# Patient Record
Sex: Female | Born: 1950 | Race: Black or African American | Hispanic: No | Smoking: Former smoker
Health system: Southern US, Community
[De-identification: ages and names within clinical notes are randomized; demographics above are authoritative.]

## PROBLEM LIST (undated history)

## (undated) DIAGNOSIS — M2011 Hallux valgus (acquired), right foot: Secondary | ICD-10-CM

## (undated) DIAGNOSIS — M65341 Trigger finger, right ring finger: Secondary | ICD-10-CM

## (undated) DIAGNOSIS — Z780 Asymptomatic menopausal state: Secondary | ICD-10-CM

## (undated) DIAGNOSIS — M2041 Other hammer toe(s) (acquired), right foot: Secondary | ICD-10-CM

## (undated) DIAGNOSIS — T7840XA Allergy, unspecified, initial encounter: Secondary | ICD-10-CM

## (undated) DIAGNOSIS — R55 Syncope and collapse: Secondary | ICD-10-CM

## (undated) DIAGNOSIS — M65331 Trigger finger, right middle finger: Secondary | ICD-10-CM

## (undated) DIAGNOSIS — D869 Sarcoidosis, unspecified: Secondary | ICD-10-CM

## (undated) HISTORY — DX: Allergy, unspecified, initial encounter: T78.40XA

## (undated) HISTORY — DX: Trigger finger, right middle finger: M65.331

## (undated) HISTORY — DX: Sarcoidosis, unspecified: D86.9

## (undated) HISTORY — DX: Syncope and collapse: R55

## (undated) HISTORY — DX: Hallux valgus (acquired), right foot: M20.11

## (undated) HISTORY — DX: Trigger finger, right ring finger: M65.341

## (undated) HISTORY — DX: Other hammer toe(s) (acquired), right foot: M20.41

## (undated) HISTORY — DX: Asymptomatic menopausal state: Z78.0

---

## 1982-08-18 HISTORY — PX: LAPAROSCOPY: SHX197

## 1984-06-17 HISTORY — PX: TOTAL ABDOMINAL HYSTERECTOMY: SHX209

## 2001-07-03 ENCOUNTER — Encounter: Payer: Self-pay | Admitting: Internal Medicine

## 2001-07-03 ENCOUNTER — Encounter: Admission: RE | Admit: 2001-07-03 | Discharge: 2001-07-03 | Payer: Self-pay | Admitting: Internal Medicine

## 2002-12-18 ENCOUNTER — Encounter: Admission: RE | Admit: 2002-12-18 | Discharge: 2002-12-18 | Payer: Self-pay | Admitting: Family Medicine

## 2006-02-17 LAB — CONVERTED CEMR LAB: Pap Smear: NORMAL

## 2006-03-29 ENCOUNTER — Encounter: Admission: RE | Admit: 2006-03-29 | Discharge: 2006-03-29 | Payer: Self-pay | Admitting: Obstetrics and Gynecology

## 2006-09-20 ENCOUNTER — Ambulatory Visit: Payer: Self-pay | Admitting: Internal Medicine

## 2006-09-20 DIAGNOSIS — D869 Sarcoidosis, unspecified: Secondary | ICD-10-CM | POA: Insufficient documentation

## 2006-09-25 LAB — CONVERTED CEMR LAB
ALT: 17 units/L (ref 0–35)
AST: 17 units/L (ref 0–37)
Albumin: 3.7 g/dL (ref 3.5–5.2)
Alkaline Phosphatase: 60 units/L (ref 39–117)
BUN: 8 mg/dL (ref 6–23)
Basophils Absolute: 0 10*3/uL (ref 0.0–0.1)
Basophils Relative: 0.2 % (ref 0.0–1.0)
Bilirubin, Direct: 0.1 mg/dL (ref 0.0–0.3)
CO2: 29 meq/L (ref 19–32)
Calcium: 8.8 mg/dL (ref 8.4–10.5)
Chloride: 105 meq/L (ref 96–112)
Cholesterol: 173 mg/dL (ref 0–200)
Creatinine, Ser: 0.7 mg/dL (ref 0.4–1.2)
Eosinophils Absolute: 0.1 10*3/uL (ref 0.0–0.6)
Eosinophils Relative: 2.2 % (ref 0.0–5.0)
GFR calc Af Amer: 111 mL/min
GFR calc non Af Amer: 92 mL/min
Glucose, Bld: 90 mg/dL (ref 70–99)
HCT: 39.3 % (ref 36.0–46.0)
HDL: 44.4 mg/dL (ref >39.0)
Hemoglobin: 13 g/dL (ref 12.0–15.0)
LDL Cholesterol: 102 mg/dL — ABNORMAL HIGH (ref 0–99)
Lymphocytes Relative: 41.4 % (ref 12.0–46.0)
MCHC: 33 g/dL (ref 30.0–36.0)
MCV: 79.7 fL (ref 78.0–100.0)
Monocytes Absolute: 0.6 10*3/uL (ref 0.2–0.7)
Monocytes Relative: 11.6 % — ABNORMAL HIGH (ref 3.0–11.0)
Neutro Abs: 2.1 10*3/uL (ref 1.4–7.7)
Neutrophils Relative %: 44.6 % (ref 43.0–77.0)
Platelets: 323 10*3/uL (ref 150–400)
Potassium: 4.1 meq/L (ref 3.5–5.1)
RBC: 4.93 M/uL (ref 3.87–5.11)
RDW: 13.2 % (ref 11.5–14.6)
Sodium: 140 meq/L (ref 135–145)
TSH: 1.17 microintl units/mL (ref 0.35–5.50)
Total Bilirubin: 0.9 mg/dL (ref 0.3–1.2)
Total CHOL/HDL Ratio: 3.9
Total Protein: 7 g/dL (ref 6.0–8.3)
Triglycerides: 134 mg/dL (ref 0–149)
VLDL: 27 mg/dL (ref 0–40)
WBC: 4.8 10*3/uL (ref 4.5–10.5)

## 2007-04-02 ENCOUNTER — Encounter: Admission: RE | Admit: 2007-04-02 | Discharge: 2007-04-02 | Payer: Self-pay | Admitting: Obstetrics and Gynecology

## 2007-10-19 ENCOUNTER — Ambulatory Visit: Payer: Self-pay | Admitting: Internal Medicine

## 2008-02-18 LAB — CONVERTED CEMR LAB: Pap Smear: NORMAL

## 2008-04-03 ENCOUNTER — Encounter: Admission: RE | Admit: 2008-04-03 | Discharge: 2008-04-03 | Payer: Self-pay | Admitting: Obstetrics and Gynecology

## 2008-12-08 ENCOUNTER — Ambulatory Visit: Payer: Self-pay | Admitting: Internal Medicine

## 2008-12-08 DIAGNOSIS — M6530 Trigger finger, unspecified finger: Secondary | ICD-10-CM | POA: Insufficient documentation

## 2008-12-08 LAB — CONVERTED CEMR LAB: Vit D, 25-Hydroxy: 39 ng/mL (ref 30–89)

## 2008-12-10 LAB — CONVERTED CEMR LAB
ALT: 24 units/L (ref 0–35)
AST: 22 units/L (ref 0–37)
BUN: 10 mg/dL (ref 6–23)
Basophils Absolute: 0 10*3/uL (ref 0.0–0.1)
Basophils Relative: 0.7 % (ref 0.0–3.0)
CO2: 31 meq/L (ref 19–32)
Calcium: 8.8 mg/dL (ref 8.4–10.5)
Chloride: 102 meq/L (ref 96–112)
Cholesterol: 172 mg/dL (ref 0–200)
Creatinine, Ser: 0.9 mg/dL (ref 0.4–1.2)
Eosinophils Absolute: 0.1 10*3/uL (ref 0.0–0.7)
Eosinophils Relative: 2.3 % (ref 0.0–5.0)
GFR calc non Af Amer: 82.43 mL/min (ref >60)
Glucose, Bld: 87 mg/dL (ref 70–99)
HCT: 44.4 % (ref 36.0–46.0)
HDL: 36 mg/dL — ABNORMAL LOW (ref >39.00)
Hemoglobin: 14.7 g/dL (ref 12.0–15.0)
LDL Cholesterol: 121 mg/dL — ABNORMAL HIGH (ref 0–99)
Lymphocytes Relative: 48.1 % — ABNORMAL HIGH (ref 12.0–46.0)
Lymphs Abs: 2.1 10*3/uL (ref 0.7–4.0)
MCHC: 33.1 g/dL (ref 30.0–36.0)
MCV: 83.7 fL (ref 78.0–100.0)
Monocytes Absolute: 0.4 10*3/uL (ref 0.1–1.0)
Monocytes Relative: 10.2 % (ref 3.0–12.0)
Neutro Abs: 1.6 10*3/uL (ref 1.4–7.7)
Neutrophils Relative %: 38.7 % — ABNORMAL LOW (ref 43.0–77.0)
Platelets: 271 10*3/uL (ref 150.0–400.0)
Potassium: 4.3 meq/L (ref 3.5–5.1)
RBC: 5.31 M/uL — ABNORMAL HIGH (ref 3.87–5.11)
RDW: 13.5 % (ref 11.5–14.6)
Sodium: 140 meq/L (ref 135–145)
TSH: 1.03 microintl units/mL (ref 0.35–5.50)
Total CHOL/HDL Ratio: 5
Triglycerides: 74 mg/dL (ref 0.0–149.0)
VLDL: 14.8 mg/dL (ref 0.0–40.0)
WBC: 4.2 10*3/uL — ABNORMAL LOW (ref 4.5–10.5)

## 2009-01-17 LAB — CONVERTED CEMR LAB: Pap Smear: NORMAL

## 2009-03-16 ENCOUNTER — Telehealth (INDEPENDENT_AMBULATORY_CARE_PROVIDER_SITE_OTHER): Payer: Self-pay | Admitting: *Deleted

## 2009-03-17 ENCOUNTER — Ambulatory Visit: Payer: Self-pay | Admitting: Internal Medicine

## 2009-03-17 DIAGNOSIS — H02409 Unspecified ptosis of unspecified eyelid: Secondary | ICD-10-CM | POA: Insufficient documentation

## 2009-03-17 DIAGNOSIS — R55 Syncope and collapse: Secondary | ICD-10-CM

## 2009-03-17 HISTORY — DX: Syncope and collapse: R55

## 2009-03-19 ENCOUNTER — Encounter: Payer: Self-pay | Admitting: Internal Medicine

## 2009-03-27 ENCOUNTER — Telehealth: Payer: Self-pay | Admitting: Internal Medicine

## 2009-04-06 ENCOUNTER — Encounter: Admission: RE | Admit: 2009-04-06 | Discharge: 2009-04-06 | Payer: Self-pay | Admitting: Obstetrics and Gynecology

## 2009-04-08 ENCOUNTER — Ambulatory Visit: Payer: Self-pay

## 2009-04-08 ENCOUNTER — Ambulatory Visit (HOSPITAL_COMMUNITY): Admission: RE | Admit: 2009-04-08 | Discharge: 2009-04-08 | Payer: Self-pay | Admitting: Internal Medicine

## 2009-04-08 ENCOUNTER — Ambulatory Visit: Payer: Self-pay | Admitting: Internal Medicine

## 2009-04-17 ENCOUNTER — Ambulatory Visit: Payer: Self-pay | Admitting: Internal Medicine

## 2009-11-16 ENCOUNTER — Encounter: Payer: Self-pay | Admitting: Internal Medicine

## 2009-12-09 ENCOUNTER — Ambulatory Visit: Payer: Self-pay | Admitting: Internal Medicine

## 2009-12-16 ENCOUNTER — Ambulatory Visit: Payer: Self-pay | Admitting: Internal Medicine

## 2009-12-21 LAB — CONVERTED CEMR LAB
ALT: 28 units/L (ref 0–35)
AST: 23 units/L (ref 0–37)
BUN: 12 mg/dL (ref 6–23)
Basophils Absolute: 0 10*3/uL (ref 0.0–0.1)
Basophils Relative: 0.5 % (ref 0.0–3.0)
CO2: 30 meq/L (ref 19–32)
Calcium: 9.1 mg/dL (ref 8.4–10.5)
Chloride: 102 meq/L (ref 96–112)
Cholesterol: 164 mg/dL (ref 0–200)
Creatinine, Ser: 0.9 mg/dL (ref 0.4–1.2)
Eosinophils Absolute: 0.1 10*3/uL (ref 0.0–0.7)
Eosinophils Relative: 2.2 % (ref 0.0–5.0)
GFR calc non Af Amer: 79.09 mL/min (ref >60)
Glucose, Bld: 102 mg/dL — ABNORMAL HIGH (ref 70–99)
HCT: 43.8 % (ref 36.0–46.0)
HDL: 37.1 mg/dL — ABNORMAL LOW (ref >39.00)
Hemoglobin: 14.5 g/dL (ref 12.0–15.0)
LDL Cholesterol: 115 mg/dL — ABNORMAL HIGH (ref 0–99)
Lymphocytes Relative: 41.1 % (ref 12.0–46.0)
Lymphs Abs: 2.3 10*3/uL (ref 0.7–4.0)
MCHC: 33.2 g/dL (ref 30.0–36.0)
MCV: 82.2 fL (ref 78.0–100.0)
Monocytes Absolute: 0.6 10*3/uL (ref 0.1–1.0)
Monocytes Relative: 10.5 % (ref 3.0–12.0)
Neutro Abs: 2.6 10*3/uL (ref 1.4–7.7)
Neutrophils Relative %: 45.7 % (ref 43.0–77.0)
Platelets: 266 10*3/uL (ref 150.0–400.0)
Potassium: 5.2 meq/L — ABNORMAL HIGH (ref 3.5–5.1)
RBC: 5.33 M/uL — ABNORMAL HIGH (ref 3.87–5.11)
RDW: 14.8 % — ABNORMAL HIGH (ref 11.5–14.6)
Sodium: 139 meq/L (ref 135–145)
Total CHOL/HDL Ratio: 4
Triglycerides: 59 mg/dL (ref 0.0–149.0)
VLDL: 11.8 mg/dL (ref 0.0–40.0)
WBC: 5.6 10*3/uL (ref 4.5–10.5)

## 2010-02-14 LAB — CONVERTED CEMR LAB
BUN: 7 mg/dL (ref 6–23)
Basophils Absolute: 0 10*3/uL (ref 0.0–0.1)
Basophils Relative: 0.7 % (ref 0.0–3.0)
CO2: 29 meq/L (ref 19–32)
Calcium: 8.6 mg/dL (ref 8.4–10.5)
Chloride: 102 meq/L (ref 96–112)
Cholesterol: 137 mg/dL (ref 0–200)
Creatinine, Ser: 0.8 mg/dL (ref 0.4–1.2)
Eosinophils Absolute: 0.2 10*3/uL (ref 0.0–0.7)
Eosinophils Relative: 3.8 % (ref 0.0–5.0)
GFR calc Af Amer: 95 mL/min
GFR calc non Af Amer: 79 mL/min
Glucose, Bld: 78 mg/dL (ref 70–99)
HCT: 40.8 % (ref 36.0–46.0)
HDL: 31.3 mg/dL — ABNORMAL LOW (ref >39.0)
Hemoglobin: 13.5 g/dL (ref 12.0–15.0)
LDL Cholesterol: 90 mg/dL (ref 0–99)
Lymphocytes Relative: 35.4 % (ref 12.0–46.0)
MCHC: 33.1 g/dL (ref 30.0–36.0)
MCV: 80.7 fL (ref 78.0–100.0)
Monocytes Absolute: 0.7 10*3/uL (ref 0.1–1.0)
Monocytes Relative: 12.7 % — ABNORMAL HIGH (ref 3.0–12.0)
Neutro Abs: 2.6 10*3/uL (ref 1.4–7.7)
Neutrophils Relative %: 47.4 % (ref 43.0–77.0)
Platelets: 299 10*3/uL (ref 150–400)
Potassium: 4 meq/L (ref 3.5–5.1)
RBC: 5.05 M/uL (ref 3.87–5.11)
RDW: 14.2 % (ref 11.5–14.6)
Sodium: 137 meq/L (ref 135–145)
TSH: 1.52 microintl units/mL (ref 0.35–5.50)
Total CHOL/HDL Ratio: 4.4
Triglycerides: 78 mg/dL (ref 0–149)
VLDL: 16 mg/dL (ref 0–40)
Vit D, 1,25-Dihydroxy: 45 (ref 30–89)
WBC: 5.4 10*3/uL (ref 4.5–10.5)

## 2010-02-16 NOTE — Assessment & Plan Note (Signed)
Summary: 1 MONTH FOLLOWUP///SPH   Vital Signs:  Patient profile:   60 year old female Height:      64.5 inches Weight:      213 pounds Pulse rate:   76 / minute BP sitting:   130 / 80  Vitals Entered By: Shary Decamp (April 17, 2009 2:22 PM) CC: rov   History of Present Illness: follow-up from previous office visit:  echocardiogram was done, report reviewed: essentially normal ( mild focal  basal and mild concentric hypertrophy of the septum. grade 1 diastolic dysfunction).   saw ophtalmology and neuro-ophtalmology  Current Medications (verified): 1)  Methyltest-Est Estrogens Hs 1.25-0.625 Mg Tabs (Est Estrogens-Methyltest) .Marland Kitchen.. 1 By Mouth Once Daily - 2)  Mvi 3)  Vitamin E 4)  Cod Liver Oil  Allergies (verified): No Known Drug Allergies  Past History:  Past Medical History: Reviewed history from 12/08/2008 and no changes required. G1 P1 h/o Sarcoidosis age 64 menopause   Past Surgical History: Reviewed history from 09/20/2006 and no changes required. 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 over all the strength of the right eye is improving denies chest pain, palpitations no abdominal pain or abdominal cramps mild constipation for few days despite a normal diet and good liquids  intake  Physical Exam  General:  alert, well-developed, and well-nourished.   Eyes:  right upper eyelid slightly weak but no as noticeable as before Lungs:  normal respiratory effort, no intercostal retractions, no accessory muscle use, and normal breath sounds.   Heart:  normal rate, regular rhythm, no murmur, and no gallop.     Impression & Recommendations:  Problem # 1:  SYNCOPE (ICD-780.2) no further events echo showed-- mild focal   basal and mild concentric hypertrophy of the septum. grade 1 diastolic dysfunction. 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 1 by mouth once daily - 2)  Mvi  3)  Vitamin E  4)  Cod Liver Oil   Patient Instructions: 1)  try  Miralax 17gr of powder a day with lots of fluids ; if your BMs are no better , please let me know  2)  came back 11-11 for your yearly check up

## 2010-02-16 NOTE — Progress Notes (Signed)
Summary: Phone  Phone Note Call from Patient Call back at [phone]   Caller: Patient Summary of Call: Patient states she had black out over the weekend and did not go to the er. She states she was out of town. Patient does not have any symptons this morning. Her right eye is closed she can see if she raises her eyelid up. Please advise Initial call taken by: Barb Merino,  March 16, 2009 9:29 AM  Follow-up for Phone Call        spoke with pt who was in [place] visiting daughter, in bathroom early sat am blacked out hit inner corner vanity   right side of  face and eye swollen.  Pt denies headaches ov scheduled for tomorrow.  If signs of headaches or swelling recommend Ed today pt agreed .Kandice Hams  March 16, 2009 10:29 AM  Follow-up by: Kandice Hams,  March 16, 2009 10:29 AM

## 2010-02-16 NOTE — Progress Notes (Signed)
Summary: pt did see neuro opth  Phone Note Outgoing Call   Call placed by: Doristine Devoid,  March 27, 2009 3:57 PM Call placed to: Patient Summary of Call: was rec referal to neuro ophtalmology, please check w/ patient , if they haven't arrange that, we will   Follow-up for Phone Call        left message on machine .......Marland KitchenDoristine Devoid  March 27, 2009 3:57 PM   pt did see neuro opth that day.  no nerve damage, +detached muscle.  Will follow up with neuro opth in 6 weeks to see if it repairs itself but they told her she would probably need surgery to repair. Follow-up by: Shary Decamp,  March 30, 2009 4:24 PM

## 2010-02-16 NOTE — Assessment & Plan Note (Signed)
Summary: fell r eye swollen,alr   Vital Signs:  Patient profile:   60 year old female Height:      64.5 inches Weight:      208 pounds BMI:     35.28 Pulse rate:   76 / minute BP sitting:   140 / 90  Vitals Entered By: Shary Decamp (March 17, 2009 1:56 PM) Comments  - passed out 2/26 after sitting on toliet trying to have bm  - rt eyelid is swollen, closed  - some bruising under rt eye Shary Decamp  March 17, 2009 2:04 PM    History of Present Illness: 3 days ago, she was visiting her daughter in [place], [place] she  went  to the bathroom, had abdominal  "spasm" and a rectal cramp; she was unable to have a BM, she then  stand up, she had no other intense abdominal spasm and she suddenly "blacked out"  , she fell and injured her right face with the edge of a small piece of furniture she was unconscious briefly, a second or two. She bled from the right cheek, ever since that moment she was unable to move her right upper eye lid She refused to go to the ER. denies tongue bite, bladder or bowel incontinence      Current Medications (verified): 1)  Methyltest-Est Estrogens Hs 1.25-0.625 Mg Tabs (Est Estrogens-Methyltest) .Marland Kitchen.. 1 By Mouth Once Daily - 2)  Mvi 3)  Vitamin E 4)  Cod Liver Oil  Allergies (verified): No Known Drug Allergies  Past History:  Past Medical History: Reviewed history from 12/08/2008 and no changes required. G1 P1 h/o Sarcoidosis age 50 menopause   Past Surgical History: Reviewed history from 09/20/2006 and no changes required. Hysterectomy (1988) Caesarean section (12/1977)  Social History: Reviewed history from 12/08/2008 and no changes required. Divorced one child lives  by herself retired Former Smoker Alcohol use-yes (occasionally) caffeine use - occasional soda or coffee Drug use-no Regular exercise-no  Review of Systems       no previous history of syncope is denies any recent chest pain, palpitation no nausea or vomiting no  headache, only date right cheek pain from the fall, this pain is actually getting better denies diplopia, vision is normal as long as she is able to hold her right upper eyelid with her fingers  Physical Exam  General:  alert and well-developed.   Head:  palpation of the face show no crepitus in the orbits.  She has a small excoriation in the right cheek without cellulitis type of changes Eyes:  anterior chambers normal Lungs:  normal respiratory effort, no intercostal retractions, no accessory muscle use, and normal breath sounds.   Heart:  normal rate, regular rhythm, and no murmur.   Neurologic:  her neurological exam is unremarkable except for the right upper eyelid which is flaccid. Specifically, pupils are equal and reactive, external ocular movements are normal Psych:  Oriented X3, memory intact for recent and remote, normally interactive, good eye contact, not anxious appearing, and not depressed appearing.     Impression & Recommendations:  Problem # 1:  SYNCOPE (ICD-780.2) Assessment New  first episode of brief syncope in the setting of abdominal cramps likely vaso-vagal EKG at baseline Plan: Echo to rule out a structural heart disease.  Cardiology referral if symptoms recur  Orders: Cardiology Referral (Cardiology)  Problem # 2:  UNSPECIFIED PTOSIS OF EYELID (ICD-374.30) R eyelid ptosis  after trauma to the right face on exam, she is  moving her eyes okay without pain, no crepitus @ the orbits plan: ophthalmology referral  Orders: Ophthalmology Referral (Ophthalmology)  Complete Medication List: 1)  Methyltest-est Estrogens Hs 1.25-0.625 Mg Tabs (Est estrogens-methyltest) .Marland Kitchen.. 1 by mouth once daily - 2)  Mvi  3)  Vitamin E  4)  Cod Liver Oil   Patient Instructions: 1)  Please schedule a follow-up appointment in 1 month.

## 2010-02-16 NOTE — Consult Note (Signed)
Summary: Caitlin Mccarty   Imported By: Lanelle Bal 03/26/2009 11:35:35  _____________________________________________________________________  External Attachment:    Type:   Image     Comment:   External Document  Appended Document: [facility] was rec referal to neuro ophtalmology, please check w/ patient , if they haven't arrange that, we will  Appended Document: [facility] see phone note

## 2010-02-16 NOTE — Assessment & Plan Note (Signed)
Summary: cpx///sph   Vital Signs:  Patient profile:   60 year old female Weight:      211.13 pounds Pulse rate:   84 / minute Pulse rhythm:   regular BP sitting:   132 / 80  (left arm) Cuff size:   regular  Vitals Entered By: Army Fossa CMA (December 09, 2009 2:58 PM) CC: CPX, not fasting  Comments declines flu shot    History of Present Illness: CPX she so a orthopedic surgeon for a trigger finger, steroid injections has helped She has seen the ophthalmologist routinely for the droopy eyelid, doing better, discharged  from their care  Allergies (verified): No Known Drug Allergies  Past History:  Past Medical History: Reviewed history from 12/08/2008 and no changes required. G1 P1 h/o Sarcoidosis age 89 menopause   Past Surgical History: Hysterectomy, complete (1988) Caesarean section (12/1977)  Family History: Father: deceased; pancreatic Ca Mother: living; breast Ca, heart murmur Colon Ca - M. Aunts MI--no Afib--M  DM - M. Family HTN - M. Family  Social History: Divorced one child lives  by herself retired Former Smoker Alcohol use-yes (occasionally) caffeine use - occasional soda or coffee Drug use-no Regular exercise-- very little   Review of Systems General:  Denies fatigue, fever, and weight loss. CV:  Denies chest pain or discomfort and swelling of feet. Resp:  Denies cough and shortness of breath. GI:  Denies bloody stools, nausea, and vomiting. GU:  Denies discharge, dysuria, and hematuria. Psych:  Denies anxiety and depression.  Physical Exam  General:  alert, well-developed, and overweight-appearing.   Neck:  no masses, no thyromegaly, and normal carotid upstroke.   Lungs:  normal respiratory effort, no intercostal retractions, no accessory muscle use, and normal breath sounds.   Heart:  normal rate, regular rhythm, no murmur, and no gallop.   Abdomen:  soft, non-tender, no distention, no masses, no guarding, and no rigidity.     Extremities:  no pretibial edema bilaterally  Psych:  Cognition and judgment appear intact. Alert and cooperative with normal attention span and concentration.  not anxious appearing and not depressed appearing.     Impression & Recommendations:  Problem # 1:  HEALTH SCREENING (ICD-V70.0) TD 2008 declined flu shot , explained benefits   sees gyn pt reports they do her breast exam, PAP, MMG , DEXA   Positive family history of colon cancer, colonoscopy in 2007 (in [place]) , reportedly negative, was told to repeat in five years.  discussed the need for a healthy diet and regular exercise Labs     Problem # 2:  UNSPECIFIED PTOSIS OF EYELID (ICD-374.30) improved Was discharged by ophthalmology August 2011  Problem # 3:  TRIGGER FINGER (ICD-727.03) status post a local injection by orthopedic surgery ----better  Complete Medication List: 1)  Methyltest-est Estrogens Hs 1.25-0.625 Mg Tabs (Est estrogens-methyltest) .Marland Kitchen.. 1 by mouth once daily - 2)  Mvi  3)  Vitamin E  4)  Cod Liver Oil   Patient Instructions: 1)  come back fasting 2)  FLP, BMP, AST, ALT, CBC---dx V70 3)  please see the  gynecologist every year 4)  Please schedule a follow-up appointment in 1 year, fasting, physical exam.    Orders Added: 1)  Est. Patient age 68-64 [16]     Preventive Care Screening  Mammogram:    Date:  04/17/2009    Results:  normal   Pap Smear:    Date:  01/17/2009    Results:  normal  Risk Factors:  Mammogram History:     Date of Last Mammogram:  04/17/2009    Results:  normal   PAP Smear History:     Date of Last PAP Smear:  01/17/2009    Results:  normal

## 2010-02-16 NOTE — Letter (Signed)
Summary: Trigger finger--[facility]  [facility]   Imported By: Lanelle Bal 11/25/2009 09:28:02  _____________________________________________________________________  External Attachment:    Type:   Image     Comment:   External Document

## 2010-03-15 ENCOUNTER — Other Ambulatory Visit: Payer: Self-pay | Admitting: Obstetrics and Gynecology

## 2010-03-15 DIAGNOSIS — Z1231 Encounter for screening mammogram for malignant neoplasm of breast: Secondary | ICD-10-CM

## 2010-04-08 ENCOUNTER — Ambulatory Visit
Admission: RE | Admit: 2010-04-08 | Discharge: 2010-04-08 | Disposition: A | Discharge status: Home / Self Care | Payer: Federal, State, Local not specified - PPO | Source: Ambulatory Visit | Attending: Obstetrics and Gynecology | Admitting: Obstetrics and Gynecology

## 2010-04-08 DIAGNOSIS — Z1231 Encounter for screening mammogram for malignant neoplasm of breast: Secondary | ICD-10-CM

## 2010-07-19 ENCOUNTER — Encounter: Payer: Self-pay | Admitting: Internal Medicine

## 2010-12-17 ENCOUNTER — Encounter: Payer: Self-pay | Admitting: Internal Medicine

## 2010-12-21 ENCOUNTER — Ambulatory Visit (INDEPENDENT_AMBULATORY_CARE_PROVIDER_SITE_OTHER): Payer: Federal, State, Local not specified - PPO | Admitting: Internal Medicine

## 2010-12-21 ENCOUNTER — Encounter: Payer: Self-pay | Admitting: Internal Medicine

## 2010-12-21 DIAGNOSIS — M6530 Trigger finger, unspecified finger: Secondary | ICD-10-CM

## 2010-12-21 DIAGNOSIS — Z0000 Encounter for general adult medical examination without abnormal findings: Secondary | ICD-10-CM

## 2010-12-21 LAB — LIPID PANEL
Cholesterol: 158 mg/dL (ref 0–200)
HDL: 39.4 mg/dL (ref >39.00)
LDL Cholesterol: 105 mg/dL — ABNORMAL HIGH (ref 0–99)
Total CHOL/HDL Ratio: 4
Triglycerides: 68 mg/dL (ref 0.0–149.0)
VLDL: 13.6 mg/dL (ref 0.0–40.0)

## 2010-12-21 LAB — COMPREHENSIVE METABOLIC PANEL
ALT: 35 U/L (ref 0–35)
AST: 26 U/L (ref 0–37)
Albumin: 3.7 g/dL (ref 3.5–5.2)
Alkaline Phosphatase: 47 U/L (ref 39–117)
BUN: 14 mg/dL (ref 6–23)
CO2: 29 mEq/L (ref 19–32)
Calcium: 8.6 mg/dL (ref 8.4–10.5)
Chloride: 104 mEq/L (ref 96–112)
Creatinine, Ser: 0.8 mg/dL (ref 0.4–1.2)
GFR: 99.5 mL/min (ref >60.00)
Glucose, Bld: 87 mg/dL (ref 70–99)
Potassium: 4.2 mEq/L (ref 3.5–5.1)
Sodium: 138 mEq/L (ref 135–145)
Total Bilirubin: 0.8 mg/dL (ref 0.3–1.2)
Total Protein: 7 g/dL (ref 6.0–8.3)

## 2010-12-21 LAB — CBC WITH DIFFERENTIAL/PLATELET
Basophils Absolute: 0 10*3/uL (ref 0.0–0.1)
Basophils Relative: 0.4 % (ref 0.0–3.0)
Eosinophils Absolute: 0.1 10*3/uL (ref 0.0–0.7)
Eosinophils Relative: 2.7 % (ref 0.0–5.0)
HCT: 43.8 % (ref 36.0–46.0)
Hemoglobin: 14.3 g/dL (ref 12.0–15.0)
Lymphocytes Relative: 34.2 % (ref 12.0–46.0)
Lymphs Abs: 1.7 10*3/uL (ref 0.7–4.0)
MCHC: 32.7 g/dL (ref 30.0–36.0)
MCV: 81.7 fl (ref 78.0–100.0)
Monocytes Absolute: 0.5 10*3/uL (ref 0.1–1.0)
Monocytes Relative: 10.6 % (ref 3.0–12.0)
Neutro Abs: 2.5 10*3/uL (ref 1.4–7.7)
Neutrophils Relative %: 52.1 % (ref 43.0–77.0)
Platelets: 278 10*3/uL (ref 150.0–400.0)
RBC: 5.36 Mil/uL — ABNORMAL HIGH (ref 3.87–5.11)
RDW: 14.8 % — ABNORMAL HIGH (ref 11.5–14.6)
WBC: 4.9 10*3/uL (ref 4.5–10.5)

## 2010-12-21 LAB — TSH: TSH: 1.05 u[IU]/mL (ref 0.35–5.50)

## 2010-12-21 NOTE — Assessment & Plan Note (Signed)
Saw ortho, got a local injection, elected not to have surgery

## 2010-12-21 NOTE — Assessment & Plan Note (Addendum)
TD 2008 declined flu shot , explained benefits  Shingles shot discussed  sees gyn, Dr Tenny Craw pt reports they do her breast exam, PAP, MMG  Last DEXA  ~ 2007, on HRT, re asses yearly. Ca and vit D encouraged   Positive family history of colon cancer, colonoscopy in 2007 (in [place]) , reportedly negative, was told to repeat in five years.will refer her to GI  discussed the need for a healthy diet and regular exercise Labs

## 2010-12-21 NOTE — Progress Notes (Signed)
  Subjective:    Patient ID: Caitlin Mccarty, female    DOB: 1950/03/25, 60 y.o.   MRN: [id number]  HPI CPX  Past Medical History  Diagnosis Date  . Sarcoidosis     age 22  . Menopause     G1 P1  . Syncope 03-2009    saw cards, all tests normal   Past Surgical History  Procedure Date  . Vesicovaginal fistula closure w/ tah     complete  . Cesarean section    History   Social History  . Marital Status: Divorced    Spouse Name: N/A    Number of Children: 1  . Years of Education: N/A   Occupational History  . Retired    Social History Main Topics  . Smoking status: Former Games developer  . Smokeless tobacco: Never Used  . Alcohol Use: Yes     Occasionally  . Drug Use: No  . Sexually Active: Not on file   Other Topics Concern  . Not on file   Social History Narrative   Lives by herself---Diet: regular---Regular Exercise: Very Little   Family History  Problem Relation Age of Onset  . Pancreatic cancer Father   . Breast cancer Mother     dx in her late 42s  . Heart murmur Mother   . Atrial fibrillation Mother   . Colon cancer      Maternal aunts x 2 (age of sx in her 66, 22s)  . Heart attack Neg Hx   . Diabetes      MATERNAL  . Hypertension      MATERNAL    Review of Systems  Constitutional: Negative for fever and fatigue.  Respiratory: Negative for cough, shortness of breath and wheezing.   Cardiovascular: Negative for chest pain and leg swelling.  Gastrointestinal: Negative for abdominal pain. Blood in stool: occ if strains.  Genitourinary: Negative for dysuria and hematuria.  Psychiatric/Behavioral:       No anxiety depression       Objective:   Physical Exam  Constitutional: She is oriented to person, place, and time. She appears well-developed. No distress.  HENT:  Head: Normocephalic and atraumatic.  Neck: No thyromegaly present.       Nl carotid pulse   Cardiovascular: Normal rate, regular rhythm and normal heart sounds.   No murmur  heard. Pulmonary/Chest: Effort normal and breath sounds normal. No respiratory distress. She has no wheezes. She has no rales.  Abdominal: Soft. She exhibits no distension. There is no tenderness. There is no rebound and no guarding.  Musculoskeletal: She exhibits no edema.  Neurological: She is alert and oriented to person, place, and time.  Skin: Skin is warm and dry. She is not diaphoretic.  Psychiatric: She has a normal mood and affect. Her behavior is normal. Judgment and thought content normal.       Assessment & Plan:

## 2010-12-21 NOTE — Patient Instructions (Signed)
Calcium and vit D daily Diet! Exercise!

## 2010-12-23 ENCOUNTER — Encounter: Payer: Self-pay | Admitting: Internal Medicine

## 2011-01-07 ENCOUNTER — Other Ambulatory Visit: Payer: Federal, State, Local not specified - PPO | Admitting: Internal Medicine

## 2011-01-19 ENCOUNTER — Ambulatory Visit (AMBULATORY_SURGERY_CENTER): Payer: Federal, State, Local not specified - PPO | Admitting: *Deleted

## 2011-01-19 ENCOUNTER — Encounter: Payer: Self-pay | Admitting: Internal Medicine

## 2011-01-19 VITALS — Ht 64.0 in | Wt 212.0 lb

## 2011-01-19 DIAGNOSIS — Z1211 Encounter for screening for malignant neoplasm of colon: Secondary | ICD-10-CM

## 2011-01-19 MED ORDER — PEG-KCL-NACL-NASULF-NA ASC-C 100 G PO SOLR: 1.0000 | Freq: Once | ORAL | Status: DC

## 2011-01-19 NOTE — Progress Notes (Signed)
Pt not allergic to eggs or soy products

## 2011-02-01 ENCOUNTER — Ambulatory Visit (AMBULATORY_SURGERY_CENTER): Payer: Federal, State, Local not specified - PPO | Admitting: Internal Medicine

## 2011-02-01 ENCOUNTER — Encounter: Payer: Self-pay | Admitting: Internal Medicine

## 2011-02-01 VITALS — BP 142/107 | HR 85 | Temp 97.5°F | Resp 16 | Ht 64.0 in | Wt 212.0 lb

## 2011-02-01 DIAGNOSIS — Z1211 Encounter for screening for malignant neoplasm of colon: Secondary | ICD-10-CM

## 2011-02-01 MED ORDER — SODIUM CHLORIDE 0.9 % IV SOLN: 500.0000 mL | INTRAVENOUS | Status: DC

## 2011-02-01 NOTE — Op Note (Signed)
[facility] [address] [place], [place]  [address]  COLONOSCOPY PROCEDURE REPORT  PATIENT:  Caitlin Mccarty, Caitlin Mccarty  MR#:  [id number] BIRTHDATE:  [birth year], 60 yrs. old  GENDER:  female ENDOSCOPIST:  Wilhemina Bonito. Eda Keys, MD REF. BY:  Willow Ora, M.D. PROCEDURE DATE:  02/01/2011 PROCEDURE:  Higher-risk screening colonoscopy G0105  ASA CLASS:  Class II INDICATIONS:  surveillance and higher-risk screening (2 aunts w/ CRC); negative index exam (I'm told) in [place] 04-2005; chronic constipation MEDICATIONS:   MAC sedation, administered by CRNA, propofol (Diprivan) 360 mg IV  DESCRIPTION OF PROCEDURE:   After the risks benefits and alternatives of the procedure were thoroughly explained, informed consent was obtained.  Digital rectal exam was performed and revealed no abnormalities.   The LB 180AL [id number] endoscope was introduced through the anus and advanced to the cecum, which was identified by both the appendix and ileocecal valve, without limitations.  The quality of the prep was excellent, using MoviPrep.  The instrument was then slowly withdrawn as the colon was fully examined. <<PROCEDUREIMAGES>>  FINDINGS:  A normal appearing cecum, ileocecal valve, and appendiceal orifice were identified. The ascending, hepatic flexure, transverse, splenic flexure, descending, sigmoid colon, and rectum appeared unremarkable.  No polyps or cancers were seen. Retroflexed views in the rectum revealed internal hemorrhoids. The time to cecum = 4:17  minutes. The scope was then withdrawn in 10:34  minutes from the cecum and the procedure completed.  COMPLICATIONS:  None  ENDOSCOPIC IMPRESSION: 1) Normal colon 2) No polyps or cancers 3) Small Internal hemorrhoids  RECOMMENDATIONS: 1) Continue current colorectal screening recommendations  with a repeat colonoscopy in 10 years.  ______________________________ Wilhemina Bonito. Eda Keys, MD  CC:  Willow Ora, MD;  The Patient  n. eSIGNED:   Wilhemina Bonito.  Eda Keys at 02/01/2011 09:25 AM  Racheal Patches, [phone]

## 2011-02-01 NOTE — Patient Instructions (Signed)
Please read the handouts given to you by your recovery room nurse.   You may resume your routine medications today.   If you  Have any questions or concerns, please call [phone].  Thank-you.

## 2011-02-01 NOTE — Progress Notes (Signed)
Patient did not have preoperative order for IV antibiotic SSI prophylaxis. (G8918)  Patient did not experience any of the following events: a burn prior to discharge; a fall within the facility; wrong site/side/patient/procedure/implant event; or a hospital transfer or hospital admission upon discharge from the facility. (G8907)  

## 2011-02-02 ENCOUNTER — Telehealth: Payer: Self-pay | Admitting: *Deleted

## 2011-02-02 NOTE — Telephone Encounter (Signed)

## 2011-02-02 NOTE — Telephone Encounter (Signed)
Pt called by Kathrynn Speed.

## 2011-03-17 ENCOUNTER — Other Ambulatory Visit: Payer: Self-pay | Admitting: Obstetrics and Gynecology

## 2011-03-17 DIAGNOSIS — Z1231 Encounter for screening mammogram for malignant neoplasm of breast: Secondary | ICD-10-CM

## 2011-04-13 ENCOUNTER — Ambulatory Visit
Admission: RE | Admit: 2011-04-13 | Discharge: 2011-04-13 | Disposition: A | Discharge status: Home / Self Care | Payer: Federal, State, Local not specified - PPO | Source: Ambulatory Visit | Attending: Obstetrics and Gynecology | Admitting: Obstetrics and Gynecology

## 2011-04-13 DIAGNOSIS — Z1231 Encounter for screening mammogram for malignant neoplasm of breast: Secondary | ICD-10-CM

## 2012-02-10 ENCOUNTER — Encounter: Payer: Self-pay | Admitting: Internal Medicine

## 2012-02-10 ENCOUNTER — Ambulatory Visit (INDEPENDENT_AMBULATORY_CARE_PROVIDER_SITE_OTHER): Payer: Federal, State, Local not specified - PPO | Admitting: Internal Medicine

## 2012-02-10 VITALS — BP 138/80 | HR 78 | Temp 98.2°F | Ht 64.25 in | Wt 210.0 lb

## 2012-02-10 DIAGNOSIS — Z0000 Encounter for general adult medical examination without abnormal findings: Secondary | ICD-10-CM

## 2012-02-10 LAB — BASIC METABOLIC PANEL
BUN: 11 mg/dL (ref 6–23)
Chloride: 100 mEq/L (ref 96–112)
Creatinine, Ser: 0.9 mg/dL (ref 0.4–1.2)
Glucose, Bld: 89 mg/dL (ref 70–99)
Potassium: 3.9 mEq/L (ref 3.5–5.1)

## 2012-02-10 LAB — LIPID PANEL
LDL Cholesterol: 113 mg/dL — ABNORMAL HIGH (ref 0–99)
VLDL: 13.2 mg/dL (ref 0.0–40.0)

## 2012-02-10 NOTE — Progress Notes (Signed)
  Subjective:    Patient ID: Caitlin Mccarty, female    DOB: 01-Mar-1950, 62 y.o.   MRN: [id number]  HPI Complete physical exam  Past Medical History  Diagnosis Date  . Sarcoidosis     age 62  . Menopause     G1 P1  . Syncope 03-2009    saw cards, all tests normal  . Allergy   . Asthma    Past Surgical History  Procedure Date  . Cesarean section   . Total abdominal hysterectomy   . Laparoscopy    History   Social History  . Marital Status: Divorced    Spouse Name: N/A    Number of Children: 1  . Years of Education: N/A   Occupational History  . Retired, Artist     Social History Main Topics  . Smoking status: Former Games developer  . Smokeless tobacco: Never Used     Comment: quit in the 70, smoked < 1 ppd   . Alcohol Use: Yes     Comment: Occasionally  . Drug Use: No  . Sexually Active: Not on file   Other Topics Concern  . Not on file   Social History Narrative   Lives by herself ---Diet: healthy , cooks a lot  ---Regular Exercise: Very Little     Family History  Problem Relation Age of Onset  . Pancreatic cancer Father   . Breast cancer Mother     dx in her late 2s  . Heart murmur Mother   . Atrial fibrillation Mother   . Colon cancer      Maternal aunts x 2 (age of sx in her 5, 28s)  . Heart attack Neg Hx   . Esophageal cancer Neg Hx   . Stomach cancer Neg Hx   . Diabetes      MATERNAL  . Hypertension      MATERNAL  . Rectal cancer Brother     Review of Systems In general feels well, from time to time he gets ill-defined numbness at the right leg if she stands in the same position for too long Also for the last week has experienced some  pain/numbness at the right side of the tight. No injury. Denies chest pain or shortness of breath No nausea, vomiting, diarrhea or blood in the stools. No vaginal discharge or bleeding. No anxiety-depression.     Objective:   Physical Exam  Musculoskeletal:       Legs:  General -- alert,  well-developed, BMI 35.   Neck --no thyromegaly , normal carotid pulse Lungs -- normal respiratory effort, no intercostal retractions, no accessory muscle use, and normal breath sounds.   Heart-- normal rate, regular rhythm, no murmur, and no gallop.   Abdomen--soft, non-tender, no distention, no masses, no HSM, no guarding, and no rigidity.   Extremities--  no pretibial edema bilaterally Not tender on either trochanteric bursa. Hip range of motion normal. Neurologic-- alert & oriented X3, DTRs are strength of the lower extremities normal. Psych-- Cognition and judgment appear intact. Alert and cooperative with normal attention span and concentration.  not anxious appearing and not depressed appearing.        Assessment & Plan:   Pain, numbness right leg, Etiology unclear, mild hip sprain? Meralgia paresthetica ?. Plan: Advil when necessary, call if not better

## 2012-02-10 NOTE — Assessment & Plan Note (Addendum)
TD 2008 No recent  flu shot  Shingles shot discussed again , not interested sees gyn, Dr Tenny Craw pt reports they do her breast exam, PAP, MMG  Last DEXA  ~ 2007, on HRT Plan: Labs BMI 35, implications discussed, recommend diet and exercise. Positive family history of colon cancer, colonoscopy in 2007 (in [place]) , reportedly negative, repeat colonoscopy, Dr. Marina Goodell, 01-2011 negative, next in 10 years Labs

## 2012-03-03 ENCOUNTER — Other Ambulatory Visit: Payer: Self-pay

## 2012-03-30 ENCOUNTER — Other Ambulatory Visit: Payer: Self-pay

## 2012-03-30 DIAGNOSIS — Z1231 Encounter for screening mammogram for malignant neoplasm of breast: Secondary | ICD-10-CM

## 2012-04-23 ENCOUNTER — Ambulatory Visit
Admission: RE | Admit: 2012-04-23 | Discharge: 2012-04-23 | Disposition: A | Discharge status: Home / Self Care | Payer: Federal, State, Local not specified - PPO | Source: Ambulatory Visit

## 2012-04-23 DIAGNOSIS — Z1231 Encounter for screening mammogram for malignant neoplasm of breast: Secondary | ICD-10-CM

## 2012-11-22 ENCOUNTER — Other Ambulatory Visit: Payer: Self-pay

## 2013-03-18 ENCOUNTER — Other Ambulatory Visit: Payer: Self-pay

## 2013-03-18 DIAGNOSIS — Z1231 Encounter for screening mammogram for malignant neoplasm of breast: Secondary | ICD-10-CM

## 2013-04-24 ENCOUNTER — Ambulatory Visit
Admission: RE | Admit: 2013-04-24 | Discharge: 2013-04-24 | Disposition: A | Discharge status: Home / Self Care | Payer: Federal, State, Local not specified - PPO | Source: Ambulatory Visit

## 2013-04-24 DIAGNOSIS — Z1231 Encounter for screening mammogram for malignant neoplasm of breast: Secondary | ICD-10-CM

## 2014-02-27 ENCOUNTER — Encounter: Payer: Self-pay | Admitting: Internal Medicine

## 2014-02-27 ENCOUNTER — Ambulatory Visit (INDEPENDENT_AMBULATORY_CARE_PROVIDER_SITE_OTHER): Payer: Federal, State, Local not specified - PPO | Admitting: Internal Medicine

## 2014-02-27 VITALS — BP 129/78 | HR 69 | Temp 98.1°F | Ht 64.0 in | Wt 215.5 lb

## 2014-02-27 DIAGNOSIS — Z0000 Encounter for general adult medical examination without abnormal findings: Secondary | ICD-10-CM

## 2014-02-27 LAB — COMPREHENSIVE METABOLIC PANEL
ALBUMIN: 3.8 g/dL (ref 3.5–5.2)
ALT: 25 U/L (ref 0–35)
AST: 21 U/L (ref 0–37)
Alkaline Phosphatase: 52 U/L (ref 39–117)
BUN: 11 mg/dL (ref 6–23)
CALCIUM: 9 mg/dL (ref 8.4–10.5)
CHLORIDE: 104 meq/L (ref 96–112)
CO2: 28 meq/L (ref 19–32)
Creatinine, Ser: 0.79 mg/dL (ref 0.40–1.20)
GFR: 94.17 mL/min (ref >60.00)
GLUCOSE: 87 mg/dL (ref 70–99)
POTASSIUM: 4 meq/L (ref 3.5–5.1)
SODIUM: 137 meq/L (ref 135–145)
TOTAL PROTEIN: 6.9 g/dL (ref 6.0–8.3)
Total Bilirubin: 1 mg/dL (ref 0.2–1.2)

## 2014-02-27 LAB — LIPID PANEL
CHOLESTEROL: 146 mg/dL (ref 0–200)
HDL: 40.6 mg/dL (ref >39.00)
LDL Cholesterol: 91 mg/dL (ref 0–99)
NonHDL: 105.4
Total CHOL/HDL Ratio: 4
Triglycerides: 73 mg/dL (ref 0.0–149.0)
VLDL: 14.6 mg/dL (ref 0.0–40.0)

## 2014-02-27 LAB — CBC WITH DIFFERENTIAL/PLATELET
BASOS ABS: 0 10*3/uL (ref 0.0–0.1)
Basophils Relative: 0.5 % (ref 0.0–3.0)
EOS PCT: 2.6 % (ref 0.0–5.0)
Eosinophils Absolute: 0.1 10*3/uL (ref 0.0–0.7)
HCT: 45.3 % (ref 36.0–46.0)
Hemoglobin: 14.8 g/dL (ref 12.0–15.0)
LYMPHS ABS: 1.9 10*3/uL (ref 0.7–4.0)
Lymphocytes Relative: 38.6 % (ref 12.0–46.0)
MCHC: 32.6 g/dL (ref 30.0–36.0)
MCV: 80 fl (ref 78.0–100.0)
MONO ABS: 0.5 10*3/uL (ref 0.1–1.0)
MONOS PCT: 10.8 % (ref 3.0–12.0)
NEUTROS PCT: 47.5 % (ref 43.0–77.0)
Neutro Abs: 2.4 10*3/uL (ref 1.4–7.7)
Platelets: 271 10*3/uL (ref 150.0–400.0)
RBC: 5.67 Mil/uL — ABNORMAL HIGH (ref 3.87–5.11)
RDW: 14.5 % (ref 11.5–15.5)
WBC: 5.1 10*3/uL (ref 4.0–10.5)

## 2014-02-27 LAB — TSH: TSH: 1.27 u[IU]/mL (ref 0.35–4.50)

## 2014-02-27 NOTE — Assessment & Plan Note (Addendum)
TD 2008 No recent  flu shot  Shingles shot discussed again, benefits, not interested Sees gyn, Dr Harrington Challenger; pt reports they do her breast exam, PAP, MMG  Last DEXA  ~ 2007, on HRT Discussed  diet and exercise. Positive family history of colon cancer, colonoscopy in 2007 (in [place]) , reportedly negative, repeat colonoscopy, Dr. Henrene Pastor, 01-2011 negative, next in 10 years Labs    Other issues: Bunions with right third toe deformity, recommend to see podiatry

## 2014-02-27 NOTE — Patient Instructions (Addendum)
Get your blood work before you leave    Stay active, try to exercise at least a total of 3 hours weekly   If you need more information about a healthy diet,   visit  the American Heart Association, it  is a great resource online at:  http://www.richard-flynn.net/   Please come back to the office in 1 year  for a physical exam. Come back fasting

## 2014-02-27 NOTE — Progress Notes (Signed)
Pre visit review using our clinic review tool, if applicable. No additional management support is needed unless otherwise documented below in the visit note. 

## 2014-02-27 NOTE — Progress Notes (Signed)
Subjective:    Patient ID: Caitlin Mccarty, female    DOB: [birth year], 64 y.o.   MRN: [id number]  DOS:  02/27/2014 Type of visit - description : cpx Interval history: Since last year, she is doing well, not very active physically, room for improvement on her diet   Review of Systems  Constitutional: Negative for fever, chills, diaphoresis, appetite change and unexpected weight change.  HENT: Negative for dental problem, ear discharge, facial swelling, trouble swallowing and voice change.   Eyes:       She had some eye redness and intolerant to light and difficulty driving at night, saw the eye doctor recently and got  a new prescription for glasses  Respiratory:       No cough or sputum production. No wheezing or SOB   Cardiovascular:       No CP or edema No palpitations   Gastrointestinal: Negative for nausea, vomiting, abdominal pain and diarrhea.       No blood in the stools   Endocrine: Negative for polydipsia, polyphagia and polyuria.  Genitourinary: Negative for urgency, frequency, hematuria and difficulty urinating.       No dysuria   Musculoskeletal: Negative for joint swelling.       Complaining of pain and deformity at the third right toe  Skin: Negative for color change, pallor and rash.  Allergic/Immunologic: Negative for environmental allergies and food allergies.  Neurological: Negative for dizziness and syncope.       No headaches   Hematological: Negative for adenopathy. Does not bruise/bleed easily.  Psychiatric/Behavioral: Negative for suicidal ideas, hallucinations, behavioral problems and confusion.       No unusual or severe anxiety-depression     Past Medical History  Diagnosis Date  . Sarcoidosis     age 31  . Menopause     G1 P1  . Syncope 03-2009    saw cards, all tests normal  . Allergy   . Asthma     Past Surgical History  Procedure Laterality Date  . Cesarean section    . Total abdominal hysterectomy    . Laparoscopy      History    Social History  . Marital Status: Divorced    Spouse Name: N/A  . Number of Children: 1  . Years of Education: N/A   Occupational History  . Retired, Dispensing optician     Social History Main Topics  . Smoking status: Former Research scientist (life sciences)  . Smokeless tobacco: Never Used     Comment: quit in the 70, smoked < 1 ppd   . Alcohol Use: Yes     Comment: Occasionally  . Drug Use: No  . Sexual Activity: Not on file   Other Topics Concern  . Not on file   Social History Narrative   Lives by herself , daughter lives in [place]   Mother is 59, lives in [place], pt visits once a month     Family History  Problem Relation Age of Onset  . Pancreatic cancer Father   . Breast cancer Mother     dx in her late 63s  . Heart murmur Mother   . Atrial fibrillation Mother   . Colon cancer Other     Maternal aunts x 2 (age of sx in her 81, 58s)  . Heart attack Neg Hx   . Esophageal cancer Neg Hx   . Stomach cancer Neg Hx   . Diabetes Other     MATERNAL side  . Hypertension  Other     MATERNAL side  . Rectal cancer Brother    \    Medication List       This list is accurate as of: 02/27/14 11:59 PM.  Always use your most recent med list.               b complex vitamins capsule  Take 1 capsule by mouth daily.     COD LIVER OIL PO  2 capsules daily.     estrogen-methylTESTOSTERone 0.625-1.25 MG per tablet  Take 1 tablet by mouth daily.     fluticasone 50 MCG/ACT nasal spray  Commonly known as:  FLONASE  Place 1 spray into both nostrils daily as needed for allergies or rhinitis.     multivitamin tablet     vitamin E 100 UNIT capsule  Take 100 Units by mouth daily.           Objective:   Physical Exam  Constitutional: She is oriented to person, place, and time. She appears well-developed. No distress.  HENT:  Head: Normocephalic and atraumatic.  Neck: Normal range of motion. Neck supple. No tracheal deviation present. No thyromegaly present.  Normal carotid pulses   Cardiovascular:  RRR, no murmur , rub or gallop  Pulmonary/Chest: Effort normal. No stridor. No respiratory distress.  CTA B  Abdominal: Soft. Bowel sounds are normal. She exhibits no distension and no mass. There is no tenderness. There is no rebound and no guarding.  No organomegaly  Musculoskeletal: She exhibits no edema or tenderness.  Prominent bunions bilaterally with no redness or TTP. The third right toe :early hammertoe?  Lymphadenopathy:    She has no cervical adenopathy.  Neurological: She is alert and oriented to person, place, and time. No cranial nerve deficit. She exhibits normal muscle tone. Coordination normal.  Speech normal, gait unassisted and normal for age, motor strength appropriate for age   Skin: Skin is warm and dry. No pallor.  No jaundice  Psychiatric: She has a normal mood and affect. Her behavior is normal. Judgment and thought content normal.  Vitals reviewed.        Assessment & Plan:   Problem List Items Addressed This Visit      Other   Annual physical exam    TD 2008 No recent  flu shot  Shingles shot discussed again, benefits, not interested Sees gyn, Dr Harrington Challenger; pt reports they do her breast exam, PAP, MMG  Last DEXA  ~ 2007, on HRT Discussed  diet and exercise. Positive family history of colon cancer, colonoscopy in 2007 (in [place]) , reportedly negative, repeat colonoscopy, Dr. Henrene Pastor, 01-2011 negative, next in 10 years Labs    Other issues: Bunions with right third toe deformity, recommend to see podiatry      Relevant Orders   Comprehensive metabolic panel (Completed)   CBC with Differential/Platelet (Completed)   TSH (Completed)   Lipid panel (Completed)

## 2014-04-21 DIAGNOSIS — N959 Unspecified menopausal and perimenopausal disorder: Secondary | ICD-10-CM | POA: Insufficient documentation

## 2014-06-23 ENCOUNTER — Other Ambulatory Visit: Payer: Self-pay

## 2014-06-23 DIAGNOSIS — Z1231 Encounter for screening mammogram for malignant neoplasm of breast: Secondary | ICD-10-CM

## 2014-06-25 ENCOUNTER — Ambulatory Visit
Admission: RE | Admit: 2014-06-25 | Discharge: 2014-06-25 | Disposition: A | Discharge status: Home / Self Care | Payer: Federal, State, Local not specified - PPO | Source: Ambulatory Visit

## 2014-06-25 DIAGNOSIS — Z1231 Encounter for screening mammogram for malignant neoplasm of breast: Secondary | ICD-10-CM

## 2015-01-18 DIAGNOSIS — M65341 Trigger finger, right ring finger: Secondary | ICD-10-CM

## 2015-01-18 HISTORY — DX: Trigger finger, right ring finger: M65.341

## 2015-02-03 DIAGNOSIS — H524 Presbyopia: Secondary | ICD-10-CM | POA: Diagnosis not present

## 2015-02-03 DIAGNOSIS — H2513 Age-related nuclear cataract, bilateral: Secondary | ICD-10-CM | POA: Diagnosis not present

## 2015-02-10 DIAGNOSIS — N959 Unspecified menopausal and perimenopausal disorder: Secondary | ICD-10-CM | POA: Diagnosis not present

## 2015-02-10 DIAGNOSIS — Z6837 Body mass index (BMI) 37.0-37.9, adult: Secondary | ICD-10-CM | POA: Diagnosis not present

## 2015-02-10 DIAGNOSIS — N760 Acute vaginitis: Secondary | ICD-10-CM | POA: Diagnosis not present

## 2015-03-04 ENCOUNTER — Encounter: Payer: Federal, State, Local not specified - PPO | Admitting: Internal Medicine

## 2015-05-12 ENCOUNTER — Telehealth: Payer: Self-pay

## 2015-05-13 ENCOUNTER — Ambulatory Visit (INDEPENDENT_AMBULATORY_CARE_PROVIDER_SITE_OTHER): Payer: Medicare Other | Admitting: Internal Medicine

## 2015-05-13 ENCOUNTER — Encounter: Payer: Self-pay | Admitting: Internal Medicine

## 2015-05-13 VITALS — BP 128/86 | HR 74 | Temp 97.9°F | Resp 16 | Ht 64.0 in | Wt 216.5 lb

## 2015-05-13 DIAGNOSIS — Z114 Encounter for screening for human immunodeficiency virus [HIV]: Secondary | ICD-10-CM

## 2015-05-13 DIAGNOSIS — Z1159 Encounter for screening for other viral diseases: Secondary | ICD-10-CM | POA: Diagnosis not present

## 2015-05-13 DIAGNOSIS — M25551 Pain in right hip: Secondary | ICD-10-CM | POA: Diagnosis not present

## 2015-05-13 DIAGNOSIS — Z0000 Encounter for general adult medical examination without abnormal findings: Secondary | ICD-10-CM

## 2015-05-13 DIAGNOSIS — M6530 Trigger finger, unspecified finger: Secondary | ICD-10-CM | POA: Diagnosis not present

## 2015-05-13 DIAGNOSIS — M79643 Pain in unspecified hand: Secondary | ICD-10-CM

## 2015-05-13 DIAGNOSIS — M609 Myositis, unspecified: Secondary | ICD-10-CM

## 2015-05-13 DIAGNOSIS — N951 Menopausal and female climacteric states: Secondary | ICD-10-CM

## 2015-05-13 DIAGNOSIS — Z09 Encounter for follow-up examination after completed treatment for conditions other than malignant neoplasm: Secondary | ICD-10-CM

## 2015-05-13 DIAGNOSIS — N959 Unspecified menopausal and perimenopausal disorder: Secondary | ICD-10-CM

## 2015-05-13 DIAGNOSIS — M791 Myalgia: Secondary | ICD-10-CM

## 2015-05-13 DIAGNOSIS — IMO0001 Reserved for inherently not codable concepts without codable children: Secondary | ICD-10-CM

## 2015-05-13 LAB — BASIC METABOLIC PANEL
BUN: 13 mg/dL (ref 6–23)
CALCIUM: 9.7 mg/dL (ref 8.4–10.5)
CHLORIDE: 101 meq/L (ref 96–112)
CO2: 31 mEq/L (ref 19–32)
CREATININE: 0.81 mg/dL (ref 0.40–1.20)
GFR: 91.15 mL/min (ref >60.00)
Glucose, Bld: 105 mg/dL — ABNORMAL HIGH (ref 70–99)
Potassium: 4.6 mEq/L (ref 3.5–5.1)
Sodium: 138 mEq/L (ref 135–145)

## 2015-05-13 LAB — HIV ANTIBODY (ROUTINE TESTING W REFLEX): HIV 1&2 Ab, 4th Generation: NONREACTIVE

## 2015-05-13 LAB — CBC
HEMATOCRIT: 44.9 % (ref 36.0–46.0)
HEMOGLOBIN: 14.5 g/dL (ref 12.0–15.0)
MCHC: 32.3 g/dL (ref 30.0–36.0)
MCV: 80.1 fl (ref 78.0–100.0)
PLATELETS: 270 10*3/uL (ref 150.0–400.0)
RBC: 5.6 Mil/uL — ABNORMAL HIGH (ref 3.87–5.11)
RDW: 14.4 % (ref 11.5–15.5)
WBC: 4.7 10*3/uL (ref 4.0–10.5)

## 2015-05-13 LAB — SEDIMENTATION RATE: SED RATE: 14 mm/h (ref 0–22)

## 2015-05-13 MED ORDER — MELOXICAM 7.5 MG PO TABS: 7.5000 mg | ORAL_TABLET | Freq: Every day | ORAL | Status: DC

## 2015-05-13 MED ORDER — AZELASTINE HCL 0.1 % NA SOLN: 2.0000 | Freq: Every evening | NASAL | Status: AC | PRN

## 2015-05-13 NOTE — Patient Instructions (Signed)
Get your blood work before you leave   Please consider visit these websites for more information regards a healthcare power of attorney: [email or web address]  ------ Hip pain: Trochanteric bursitis, ICE twice a day, meloxicam for one month as needed  Trigger finger: call if severe or  not improving in the next few weeks  Meralgia paresthetica? Loss weight  is the appropriate treatment, call if symptoms increase  Meloxicam 7.5 mg one or 2 tablets a day as needed for pain.  Always take it with food because may cause gastritis and ulcers.  If you notice nausea, stomach pain, change in the color of stools --->  Stop the medicine and let us know]   Fall Prevention and Home Safety Falls cause injuries and can affect all age groups. It is possible to use preventive measures to significantly decrease the likelihood of falls. There are many simple measures which can make your home safer and prevent falls. OUTDOORS  Repair cracks and edges of walkways and driveways.  Remove high doorway thresholds.  Trim shrubbery on the main path into your home.  Have good outside lighting.  Clear walkways of tools, rocks, debris, and clutter.  Check that handrails are not broken and are securely fastened. Both sides of steps should have handrails.  Have leaves, snow, and ice cleared regularly.  Use sand or salt on walkways during winter months.  In the garage, clean up grease or oil spills. BATHROOM  Install night lights.  Install grab bars by the toilet and in the tub and shower.  Use non-skid mats or decals in the tub or shower.  Place a plastic non-slip stool in the shower to sit on, if needed.  Keep floors dry and clean up all water on the floor immediately.  Remove soap buildup in the tub or shower on a regular basis.  Secure bath mats with non-slip, double-sided rug tape.  Remove throw rugs and tripping hazards from the  floors. BEDROOMS  Install night lights.  Make sure a bedside light is easy to reach.  Do not use oversized bedding.  Keep a telephone by your bedside.  Have a firm chair with side arms to use for getting dressed.  Remove throw rugs and tripping hazards from the floor. KITCHEN  Keep handles on pots and pans turned toward the center of the stove. Use back burners when possible.  Clean up spills quickly and allow time for drying.  Avoid walking on wet floors.  Avoid hot utensils and knives.  Position shelves so they are not too high or low.  Place commonly used objects within easy reach.  If necessary, use a sturdy step stool with a grab bar when reaching.  Keep electrical cables out of the way.  Do not use floor polish or wax that makes floors slippery. If you must use wax, use non-skid floor wax.  Remove throw rugs and tripping hazards from the floor. STAIRWAYS  Never leave objects on stairs.  Place handrails on both sides of stairways and use them. Fix any loose handrails. Make sure handrails on both sides of the stairways are as long as the stairs.  Check carpeting to make sure it is firmly attached along stairs. Make repairs to worn or loose carpet promptly.  Avoid placing throw rugs at the top or bottom of stairways, or properly secure the rug with carpet tape to prevent slippage. Get rid of throw rugs, if possible.  Have an electrician put in a light switch at the top  and bottom of the stairs. OTHER FALL PREVENTION TIPS  Wear low-heel or rubber-soled shoes that are supportive and fit well. Wear closed toe shoes.  When using a stepladder, make sure it is fully opened and both spreaders are firmly locked. Do not climb a closed stepladder.  Add color or contrast paint or tape to grab bars and handrails in your home. Place contrasting color strips on first and last steps.  Learn and use mobility aids as needed. Install an electrical emergency response  system.  Turn on lights to avoid dark areas. Replace light bulbs that burn out immediately. Get light switches that glow.  Arrange furniture to create clear pathways. Keep furniture in the same place.  Firmly attach carpet with non-skid or double-sided tape.  Eliminate uneven floor surfaces.  Select a carpet pattern that does not visually hide the edge of steps.  Be aware of all pets. OTHER HOME SAFETY TIPS  Set the water temperature for 120 F (48.8 C).  Keep emergency numbers on or near the telephone.  Keep smoke detectors on every level of the home and near sleeping areas. Document Released: 12/24/2001 Document Revised: 07/05/2011 Document Reviewed: 03/25/2011 [facility] Patient Information 2015 [place], [place]. This information is not intended to replace advice given to you by your health care provider. Make sure you discuss any questions you have with your health care provider.   Preventive Care for Adults Ages 27 and over  Blood pressure check.** / Every 1 to 2 years.  Lipid and cholesterol check.**/ Every 5 years beginning at age 85.  Lung cancer screening. / Every year if you are aged 22-80 years and have a 30-pack-year history of smoking and currently smoke or have quit within the past 15 years. Yearly screening is stopped once you have quit smoking for at least 15 years or develop a health problem that would prevent you from having lung cancer treatment.  Fecal occult blood test (FOBT) of stool. / Every year beginning at age 76 and continuing until age 57. You may not have to do this test if you get a colonoscopy every 10 years.  Flexible sigmoidoscopy** or colonoscopy.** / Every 5 years for a flexible sigmoidoscopy or every 10 years for a colonoscopy beginning at age 48 and continuing until age 65.  Hepatitis C blood test.** / For all people born from [birth year] through 1965 and any individual with known risks for hepatitis C.  Abdominal aortic aneurysm (AAA) screening.** /  A one-time screening for ages 20 to 55 years who are current or former smokers.  Skin self-exam. / Monthly.  Influenza vaccine. / Every year.  Tetanus, diphtheria, and acellular pertussis (Tdap/Td) vaccine.** / 1 dose of Td every 10 years.  Varicella vaccine.** / Consult your health care provider.  Zoster vaccine.** / 1 dose for adults aged 74 years or older.  Pneumococcal 13-valent conjugate (PCV13) vaccine.** / Consult your health care provider.  Pneumococcal polysaccharide (PPSV23) vaccine.** / 1 dose for all adults aged 23 years and older.  Meningococcal vaccine.** / Consult your health care provider.  Hepatitis A vaccine.** / Consult your health care provider.  Hepatitis B vaccine.** / Consult your health care provider.  Haemophilus influenzae type b (Hib) vaccine.** / Consult your health care provider. **Family history and personal history of risk and conditions may change your health care provider's recommendations. Document Released: 03/01/2001 Document Revised: 01/08/2013 Document Reviewed: 05/31/2010 [facility] Patient Information 2015 [place], [place]. This information is not intended to replace advice given to you by  your health care provider. Make sure you discuss any questions you have with your health care provider.

## 2015-05-13 NOTE — Progress Notes (Signed)
Pre visit review using our clinic review tool, if applicable. No additional management support is needed unless otherwise documented below in the visit note/SLS  

## 2015-05-13 NOTE — Assessment & Plan Note (Signed)
Allergies: Needs better control, switch flonase to Nasacort, add Astelin, continue OTC antihistaminics Asthma: Not an issue at this point MSK: R trochanteric bursa, trigger finger, and seems to have meralgia paresthetica . Rx  NSAIDs for one month as needed, GI protection discussed, check a BMP, CBC and sedimentation rate. If not better, call for sports medicine referral RTC 1 year

## 2015-05-13 NOTE — Assessment & Plan Note (Addendum)
TD 2008; PNM 22 -  prevnar pro/cons discussed, declined Shingles shot discussed before,  not interested  Sees gyn, last OV 02-2015 DR Stann Mainland MMG 2016 (-)   Last DEXA  ~ 2007, on HRT, interested , will schedule + FH colon cancer, colonoscopy in 2007 (in [place]) , reportedly negative; Cscope  Dr. Henrene Pastor, 01-2011 negative, next in 10 years Labs : Check a CBC, sedimentation rate, BMP, HIV and hep C. Diet -exercise discussed

## 2015-05-13 NOTE — Progress Notes (Signed)
Subjective:    Patient ID: Caitlin Mccarty, female    DOB: Jul 29, 1950, 65 y.o.   MRN: [id number]  DOS:  05/13/2015 Type of visit - description :  Here for Medicare AWV:  1. Risk factors based on Past M, S, F history: reviewed 2. Physical Activities:  Sedentary by choice  3. Depression/mood: neg screening  4. Hearing:  No problems noted or reported  5. ADL's: independent, drives  6. Fall Risk: no recent falls, prevention discussed , see AVS 7. home Safety: does feel safe at home  8. Height, weight, & visual acuity: see VS, sees eye doctor regulalrly 9. Counseling: provided 10. Labs ordered based on risk factors: if needed  11. Referral Coordination: if needed 12. Care Plan, see assessment and plan , written personalized plan provided , see AVS 13. Cognitive Assessment: motor skills and cognition appropriate for age 16. Care team updated   15. End-of-life care discussed (HC-POA)  In addition, today we discussed the following: Allergies: Flonase and antihistamine OTC, not working 100%, having some nose irritation with Flonase. Trigger fingers, slightly worse in the last few months mostly at the right second and fourth finger. Has pain at the right hip, worse when she laid down on the right side at night, no injury, no ck pain, no major problems when walking. Occasionally numbness at the anterior left thigh when walking   Review of Systems  Constitutional: No fever. No chills. No unexplained wt changes. No unusual sweats  HEENT: No dental problems, no ear discharge, no facial swelling, no voice changes. No eye discharge, no eye  redness , no  intolerance to light   Respiratory: No wheezing , no  difficulty breathing. No cough , no mucus production  Cardiovascular: No CP, no leg swelling , no  Palpitations  GI: no nausea, no vomiting, no diarrhea , no  abdominal pain.  No blood in the stools. No dysphagia, no odynophagia    Endocrine: No polyphagia, no polyuria , no  polydipsia  GU: No dysuria, gross hematuria, difficulty urinating. No urinary urgency, no frequency.  Musculoskeletal: No joint swellings , + aches or pains  Skin: No change in the color of the skin, palor , no  Rash  Allergic, immunologic: + environmental allergies , no  food allergies  Neurological: No dizziness no  syncope. No headaches. No diplopia, no slurred, no slurred speech, no motor deficits, no facial  Numbness  Hematological: No enlarged lymph nodes, no easy bruising , no unusual bleedings  Psychiatry: No suicidal ideas, no hallucinations, no beavior problems, no confusion.  No unusual/severe anxiety, no depression    Past Medical History  Diagnosis Date  . Sarcoidosis ([place])     age 80  . Menopause     G1 P1  . Syncope 03-2009    saw cards, all tests normal  . Allergy   . Asthma     Past Surgical History  Procedure Laterality Date  . Cesarean section  12/1977    x1  . Total abdominal hysterectomy  06/1984  . Laparoscopy  08/1982    Social History   Social History  . Marital Status: Divorced    Spouse Name: N/A  . Number of Children: 1  . Years of Education: N/A   Occupational History  . Retired, Dispensing optician     Social History Main Topics  . Smoking status: Former Research scientist (life sciences)  . Smokeless tobacco: Never Used     Comment: quit in the 23s, smoked < 1  ppd   . Alcohol Use: 0.0 oz/week    0 Standard drinks or equivalent per week     Comment: Occasionally  . Drug Use: No  . Sexual Activity: Not on file   Other Topics Concern  . Not on file   Social History Narrative   Lives by herself , daughter lives in [place]   Mother is 67, lives in [place], pt visits once a month     Family History  Problem Relation Age of Onset  . Pancreatic cancer Father   . Breast cancer Mother     dx in her late 75s  . Heart murmur Mother   . Atrial fibrillation Mother   . Colon cancer Other     Maternal aunts x 2 (age of sx in her 17, 37s)  . Heart attack Neg Hx   .  Esophageal cancer Neg Hx   . Stomach cancer Neg Hx   . Diabetes Other     MATERNAL side  . Hypertension Other     MATERNAL side  . Rectal cancer Brother       Medication List       This list is accurate as of: 05/13/15  2:56 PM.  Always use your most recent med list.               ALAVERT 10 MG tablet  Generic drug:  loratadine  Take 10 mg by mouth daily as needed for allergies.     azelastine 0.1 % nasal spray  Commonly known as:  ASTELIN  Place 2 sprays into both nostrils at bedtime as needed for rhinitis. Use in each nostril as directed     b complex vitamins capsule  Take 1 capsule by mouth daily.     COD LIVER OIL PO  1 capsule daily.     estradiol 0.5 MG tablet  Commonly known as:  ESTRACE  Take 0.5 mg by mouth daily.     fluticasone 50 MCG/ACT nasal spray  Commonly known as:  FLONASE  Place 1 spray into both nostrils daily as needed for allergies or rhinitis.     meloxicam 7.5 MG tablet  Commonly known as:  MOBIC  Take 1-2 tablets (7.5-15 mg total) by mouth daily.     multivitamin tablet  Take by mouth daily.     vitamin E 100 UNIT capsule  Take 100 Units by mouth daily.           Objective:   Physical Exam BP 128/86 mmHg  Pulse 74  Temp(Src) 97.9 F (36.6 C) (Oral)  Resp 16  Ht 5\' 4"  (1.626 m)  Wt 216 lb 8 oz (98.204 kg)  BMI 37.14 kg/m2  SpO2 98%   General:   Well developed, well nourished . NAD.  Neck: No  Thyromegaly  HEENT:  Normocephalic . Face symmetric, atraumatic Lungs:  CTA B Normal respiratory effort, no intercostal retractions, no accessory muscle use. Heart: RRR,  no murmur.  No pretibial edema bilaterally  Abdomen:  Not distended, soft, non-tender. No rebound or rigidity.   MSK: Back no TTP Hips: Normal rotation bilaterally without pain, normal range of motion, mildly TTP at the right trochanteric bursa coincides with the perceived pain Hands: Normal to inspection, no deformities or synovitis. Slightly TTP at the  palmar aspect of the right hand. Skin: Exposed areas without rash. Not pale. Not jaundice Neurologic:  alert & oriented X3.  Speech normal, gait appropriate for age and unassisted Strength symmetric and appropriate for  age.  Psych: Cognition and judgment appear intact.  Cooperative with normal attention span and concentration.  Behavior appropriate. No anxious or depressed appearing.    Assessment & Plan:  Assessment Allergies Asthma Menopausal H/o Sarcoidosis DX age  85 H/o Syncope, 2011, saw cards w/u (-)  PLAN: Allergies: Needs better control, switch flonase to Nasacort, add Astelin, continue OTC antihistaminics Asthma: Not an issue at this point MSK: R trochanteric bursa, trigger finger, and seems to have meralgia paresthetica . Rx  NSAIDs for one month as needed, GI protection discussed, check a BMP, CBC and sedimentation rate. If not better, call for sports medicine referral RTC 1 year

## 2015-05-14 LAB — HEPATITIS C ANTIBODY: HCV AB: NEGATIVE

## 2015-05-18 ENCOUNTER — Ambulatory Visit (HOSPITAL_BASED_OUTPATIENT_CLINIC_OR_DEPARTMENT_OTHER)
Admission: RE | Admit: 2015-05-18 | Discharge: 2015-05-18 | Disposition: A | Discharge status: Home / Self Care | Payer: Medicare Other | Source: Ambulatory Visit | Attending: Internal Medicine | Admitting: Internal Medicine

## 2015-05-18 DIAGNOSIS — Z780 Asymptomatic menopausal state: Secondary | ICD-10-CM | POA: Insufficient documentation

## 2015-05-18 DIAGNOSIS — M791 Myalgia: Secondary | ICD-10-CM | POA: Diagnosis not present

## 2015-05-18 DIAGNOSIS — Z720 Tobacco use: Secondary | ICD-10-CM | POA: Diagnosis not present

## 2015-05-18 DIAGNOSIS — N951 Menopausal and female climacteric states: Secondary | ICD-10-CM

## 2015-05-18 DIAGNOSIS — IMO0001 Reserved for inherently not codable concepts without codable children: Secondary | ICD-10-CM

## 2015-05-18 DIAGNOSIS — M609 Myositis, unspecified: Secondary | ICD-10-CM | POA: Insufficient documentation

## 2015-05-18 DIAGNOSIS — N959 Unspecified menopausal and perimenopausal disorder: Secondary | ICD-10-CM | POA: Insufficient documentation

## 2015-05-18 DIAGNOSIS — Z13820 Encounter for screening for osteoporosis: Secondary | ICD-10-CM | POA: Diagnosis not present

## 2015-05-19 DIAGNOSIS — M2041 Other hammer toe(s) (acquired), right foot: Secondary | ICD-10-CM | POA: Diagnosis not present

## 2015-05-19 DIAGNOSIS — M2011 Hallux valgus (acquired), right foot: Secondary | ICD-10-CM | POA: Diagnosis not present

## 2015-05-25 ENCOUNTER — Encounter: Payer: Federal, State, Local not specified - PPO | Admitting: Internal Medicine

## 2015-06-08 ENCOUNTER — Other Ambulatory Visit: Payer: Self-pay | Admitting: Internal Medicine

## 2015-06-08 DIAGNOSIS — Z1231 Encounter for screening mammogram for malignant neoplasm of breast: Secondary | ICD-10-CM

## 2015-06-08 NOTE — Telephone Encounter (Signed)
EstradIol sent to pharmacy.

## 2015-07-02 ENCOUNTER — Ambulatory Visit (HOSPITAL_BASED_OUTPATIENT_CLINIC_OR_DEPARTMENT_OTHER)
Admission: RE | Admit: 2015-07-02 | Discharge: 2015-07-02 | Disposition: A | Discharge status: Home / Self Care | Payer: Medicare Other | Source: Ambulatory Visit | Attending: Internal Medicine | Admitting: Internal Medicine

## 2015-07-02 DIAGNOSIS — Z1231 Encounter for screening mammogram for malignant neoplasm of breast: Secondary | ICD-10-CM | POA: Diagnosis not present

## 2015-07-06 DIAGNOSIS — M79641 Pain in right hand: Secondary | ICD-10-CM | POA: Diagnosis not present

## 2015-07-06 DIAGNOSIS — M65341 Trigger finger, right ring finger: Secondary | ICD-10-CM | POA: Diagnosis not present

## 2016-02-29 DIAGNOSIS — Z1289 Encounter for screening for malignant neoplasm of other sites: Secondary | ICD-10-CM | POA: Diagnosis not present

## 2016-02-29 DIAGNOSIS — Z6837 Body mass index (BMI) 37.0-37.9, adult: Secondary | ICD-10-CM | POA: Diagnosis not present

## 2016-03-09 DIAGNOSIS — H524 Presbyopia: Secondary | ICD-10-CM | POA: Diagnosis not present

## 2016-03-09 DIAGNOSIS — H2513 Age-related nuclear cataract, bilateral: Secondary | ICD-10-CM | POA: Diagnosis not present

## 2016-04-06 ENCOUNTER — Telehealth: Payer: Self-pay | Admitting: Internal Medicine

## 2016-04-06 NOTE — Telephone Encounter (Signed)
Called patient to schedule awv. lvm for patient to call office to schedule appt.  °

## 2016-04-07 ENCOUNTER — Telehealth: Payer: Self-pay | Admitting: Internal Medicine

## 2016-04-07 NOTE — Telephone Encounter (Signed)
Patient called office to schedule appt. AWV is scheduled for April 2018.

## 2016-04-21 ENCOUNTER — Ambulatory Visit (INDEPENDENT_AMBULATORY_CARE_PROVIDER_SITE_OTHER): Payer: Medicare Other | Admitting: Orthopedic Surgery

## 2016-04-21 ENCOUNTER — Encounter (INDEPENDENT_AMBULATORY_CARE_PROVIDER_SITE_OTHER): Payer: Self-pay | Admitting: Orthopedic Surgery

## 2016-04-21 ENCOUNTER — Ambulatory Visit (INDEPENDENT_AMBULATORY_CARE_PROVIDER_SITE_OTHER): Payer: Medicare Other

## 2016-04-21 DIAGNOSIS — M79642 Pain in left hand: Secondary | ICD-10-CM | POA: Diagnosis not present

## 2016-04-22 NOTE — Progress Notes (Signed)
Office Visit Note   Patient: Caitlin Mccarty           Date of Birth: 1950/08/03           MRN: [id number] Visit Date: 04/21/2016 Requested by: Colon Branch, MD [address] [place], [place] [address] PCP: Kathlene November, MD  Subjective: Chief Complaint  Patient presents with  . Left Wrist - Pain    HPI: Patient is a 66 year old patient with left hand and wrist pain.  Been going on for 6 months but worse over the past 4 weeks the pain is becoming more consistent.  She also describes numbness and tingling in the index finger and thumb.  Radiates in the arm region.  She states that her arm aches.  He will go all the way up into the biceps.  She does report occasional neck pain and she has been dropping things.  Her symptoms will wake her from sleep one night at a 7.  Her symptoms are deathly better with her arm overhead.  Aleve has helped her symptoms.              ROS: All systems reviewed are negative as they relate to the chief complaint within the history of present illness.  Patient denies  fevers or chills.   Assessment & Plan: Visit Diagnoses:  1. Pain of left hand     Plan: Plan is that there is a high likelihood this represents cervical radiculopathy.  She is an MRI of her cervical spine to evaluate left-sided C6 radiculopathy which is pretty classic.  I also like to send her to Dr. Ernestina Patches for nerve conduction study/EMG to evaluate radiculopathy versus carpal tunnel syndrome.  I'll see her back after those studies.  Follow-Up Instructions: No Follow-up on file.   Orders:  Orders Placed This Encounter  Procedures  . XR Cervical Spine 2 or 3 views  . MR Cervical Spine w/o contrast  . Ambulatory referral to Physical Medicine Rehab   No orders of the defined types were placed in this encounter.     Procedures: No procedures performed   Clinical Data: No additional findings.  Objective: Vital Signs: There were no vitals taken for this visit.  Physical Exam:    Constitutional: Patient appears well-developed HEENT:  Head: Normocephalic Eyes:EOM are normal Neck: Normal range of motion Cardiovascular: Normal rate Pulmonary/chest: Effort normal Neurologic: Patient is alert Skin: Skin is warm Psychiatric: Patient has normal mood and affect    Ortho Exam: Orthopedic exam demonstrates reasonable cervical spine range of motion but with some reproduction of symptoms with rotation of the head to the left.  Patient has palpable radial pulses bilaterally.  She does have paresthesias on the palmar and dorsal surface of the left hand in the C6 distribution.  Negative Tinel's cubital tunnel in the elbow.  Full wrist elbow shoulder range of motion present.  No other masses lymph adenopathy or skin changes noted in the shoulder girdle region or arm region.  Reflexes symmetric bilateral biceps and triceps.  Specialty Comments:  No specialty comments available.  Imaging: No results found.   PMFS History: Patient Active Problem List   Diagnosis Date Noted  . PCP NOTES >>>>>>>>>>>>>>>>>>>>> 05/13/2015  . Menopausal and postmenopausal disorder 04/21/2014  . Annual physical exam 12/21/2010  . UNSPECIFIED PTOSIS OF EYELID 03/17/2009  . Trigger finger, acquired 12/08/2008  . SARCOIDOSIS, PULMONARY 09/20/2006   Past Medical History:  Diagnosis Date  . Allergy   .  Asthma   . Hallux valgus of right foot    see's Derek Jack, DPM  . Hammer toe of right foot    see's Derek Jack, DPM  . Menopause    G1 P1  . Sarcoidosis ([place])    age 62  . Syncope 03-2009   saw cards, all tests normal  . Trigger middle finger of right hand    hx of  . Trigger ring finger of right hand 2017    Family History  Problem Relation Age of Onset  . Pancreatic cancer Father   . Breast cancer Mother     dx in her late 33s  . Heart murmur Mother   . Atrial fibrillation Mother   . Colon cancer Other     Maternal aunts x 2 (age of sx in her 63, 66s)  . Heart attack  Neg Hx   . Esophageal cancer Neg Hx   . Stomach cancer Neg Hx   . Diabetes Other     MATERNAL side  . Hypertension Other     MATERNAL side  . Rectal cancer Brother     Past Surgical History:  Procedure Laterality Date  . CESAREAN SECTION  12/1977   x1  . LAPAROSCOPY  08/1982  . TOTAL ABDOMINAL HYSTERECTOMY  06/1984   Social History   Occupational History  . Retired, Dispensing optician     Social History Main Topics  . Smoking status: Former Research scientist (life sciences)  . Smokeless tobacco: Never Used     Comment: quit in the 70s, smoked < 1 ppd   . Alcohol use 0.0 oz/week     Comment: Occasionally  . Drug use: No  . Sexual activity: Not on file

## 2016-05-05 ENCOUNTER — Ambulatory Visit
Admission: RE | Admit: 2016-05-05 | Discharge: 2016-05-05 | Disposition: A | Discharge status: Home / Self Care | Payer: Medicare Other | Source: Ambulatory Visit | Attending: Orthopedic Surgery | Admitting: Orthopedic Surgery

## 2016-05-05 DIAGNOSIS — M4802 Spinal stenosis, cervical region: Secondary | ICD-10-CM | POA: Diagnosis not present

## 2016-05-05 DIAGNOSIS — M79642 Pain in left hand: Secondary | ICD-10-CM

## 2016-05-06 ENCOUNTER — Encounter (INDEPENDENT_AMBULATORY_CARE_PROVIDER_SITE_OTHER): Payer: Self-pay | Admitting: Physical Medicine and Rehabilitation

## 2016-05-06 ENCOUNTER — Ambulatory Visit (INDEPENDENT_AMBULATORY_CARE_PROVIDER_SITE_OTHER): Payer: Medicare Other | Admitting: Physical Medicine and Rehabilitation

## 2016-05-06 DIAGNOSIS — R202 Paresthesia of skin: Secondary | ICD-10-CM | POA: Diagnosis not present

## 2016-05-06 DIAGNOSIS — M542 Cervicalgia: Secondary | ICD-10-CM

## 2016-05-06 NOTE — Progress Notes (Deleted)
Right hand dominant Has a pins and needles sensation starting in second finger of left hand. The sensation in the finger is constant and sometimes moves into thumb and forearm. Can relieve it by holding hand up. Sometimes has aching type pain in left upper arm and left side of neck. Drops things more often. Sometimes the sensation is worse in hand and forearm in the mornings.

## 2016-05-09 NOTE — Progress Notes (Signed)
Caitlin Mccarty - 66 y.o. female MRN [id number]  Date of birth: [birth year]  Office Visit Note: Visit Date: 05/06/2016 PCP: Kathlene November, MD Referred by: Colon Branch, MD  Subjective: Chief Complaint  Patient presents with  . Left Hand - Numbness   HPI: Caitlin Mccarty is a very pleasant right-hand dominant 66 year old female who is been followed by Dr. Marlou Sa for chronic worsening left hand numbness particularly on the radial side of the first digit and ulnar side of the foam. She states that the sensation in the finger is constant and is more in the index finger than the foam. She does get occasional aching type pain in the left upper arm and left side of the neck. She does find that she is dropping some objects more often but is unsure if it's related. She also gets some worsening in the morning than at night. She actually gets some relief holding her arm up and sit down. She doesn't notice any differences with the reading or driving. She denies any right-handed symptoms. She has had no prior electrodiagnostic studies. She did have a recent MRI of the cervical spine which is reviewed below and I did review this with the patient.    ROS Otherwise per HPI.  Assessment & Plan: Visit Diagnoses:  1. Paresthesia of skin   2. Cervicalgia     Plan: No additional findings.  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   **However, clinically, I do agree with Dr. Marlou Sa that this seems to be more related to a cervical radiculopathy/ radiculitis. Cervical MRI shows left-sided herniated disc and lateral recess stenosis  At C 3-4 which can still cause more of a C6 radiculitis symptoms. As you know, this particular electrodiagnostic study cannot rule out chemical radiculitis or sensory only radiculopathy.  Recommendations: 1.  Follow-up with referring physician. 2.   Continue current management of symptoms, consider diagnostic carpal tunnel injection.  Her symptoms are also mild enough without weakness that she might benefit from watchful waiting with likely resolution of the cervical herniated disc with time.    Meds & Orders: No orders of the defined types were placed in this encounter.   Orders Placed This Encounter  Procedures  . NCV with EMG (electromyography)    Follow-up: Return for Dr. Marlou Sa.   Procedures: No procedures performed  EMG & NCV Findings: Evaluation of the left median (across palm) sensory nerve showed prolonged distal peak latency (Wrist, 4.2 ms).  All remaining nerves (as indicated in the following tables) were within normal limits.    All examined muscles (as indicated in the following table) showed no evidence of electrical instability.    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   **However, clinically, I do agree with Dr. Marlou Sa at this seems to be more related to a cervical radiculopathy/ radiculitis. Cervical MRI shows left-sided herniated disc and lateral recess stenosis  At C 3-4 which can still cause more of a C6 radiculitis symptoms. As you know, this particular electrodiagnostic study cannot rule out chemical radiculitis or sensory only radiculopathy.  Recommendations: 1.  Follow-up with referring physician. 2.  Continue current management of symptoms, consider diagnostic carpal tunnel injection.  Her symptoms are also mild enough without weakness that she might benefit from watchful  waiting with likely resolution of the cervical herniated disc with time.    Nerve Conduction Studies Anti Sensory Summary Table   Stim Site NR Peak (ms) Norm Peak (ms) P-T Amp (V) Norm P-T Amp Site1 Site2 Delta-P (ms) Dist (cm) Vel (m/s) Norm Vel (m/s)  Left Median Acr Palm Anti Sensory (2nd  Digit)  31.9C  Wrist    *4.2 <3.6 18.3 >10 Wrist Palm 2.4 0.0    Palm    1.8 <2.0 31.7         Left Radial Anti Sensory (Base 1st Digit)  31.3C  Wrist    2.0 <3.1 26.5  Wrist Base 1st Digit 2.0 0.0    Left Ulnar Anti Sensory (5th Digit)  31.7C  Wrist    3.1 <3.7 32.9 >15.0 Wrist 5th Digit 3.1 14.0 45 >38   Motor Summary Table   Stim Site NR Onset (ms) Norm Onset (ms) O-P Amp (mV) Norm O-P Amp Site1 Site2 Delta-0 (ms) Dist (cm) Vel (m/s) Norm Vel (m/s)  Left Median Motor (Abd Poll Brev)  31.6C  Wrist    4.1 <4.2 8.5 >5 Elbow Wrist 3.7 21.0 57 >50  Elbow    7.8  7.8         Left Ulnar Motor (Abd Dig Min)  31.5C  Wrist    2.8 <4.2 9.6 >3 B Elbow Wrist 3.1 21.0 68 >53  B Elbow    5.9  9.4  A Elbow B Elbow 1.2 11.0 92 >53  A Elbow    7.1  8.7          EMG   Side Muscle Nerve Root Ins Act Fibs Psw Amp Dur Poly Recrt Int Fraser Din Comment  Left 1stDorInt Ulnar C8-T1 Nml Nml Nml Nml Nml 0 Nml Nml   Left Abd Poll Brev Median C8-T1 Nml Nml Nml Nml Nml 0 Nml Nml   Left ExtDigCom   Nml Nml Nml Nml Nml 0 Nml Nml   Left Triceps Radial C6-7-8 Nml Nml Nml Nml Nml 0 Nml Nml   Left Deltoid Axillary C5-6 Nml Nml Nml Nml Nml 0 Nml Nml     Nerve Conduction Studies Anti Sensory Left/Right Comparison   Stim Site L Lat (ms) R Lat (ms) L-R Lat (ms) L Amp (V) R Amp (V) L-R Amp (%) Site1 Site2 L Vel (m/s) R Vel (m/s) L-R Vel (m/s)  Median Acr Palm Anti Sensory (2nd Digit)  31.9C  Wrist *4.2   18.3   Wrist Palm     Palm 1.8   31.7         Radial Anti Sensory (Base 1st Digit)  31.3C  Wrist 2.0   26.5   Wrist Base 1st Digit     Ulnar Anti Sensory (5th Digit)  31.7C  Wrist 3.1   32.9   Wrist 5th Digit 45     Motor Left/Right Comparison   Stim Site L Lat (ms) R Lat (ms) L-R Lat (ms) L Amp (mV) R Amp (mV) L-R Amp (%) Site1 Site2 L Vel (m/s) R Vel (m/s) L-R Vel (m/s)  Median Motor (Abd Poll Brev)  31.6C  Wrist 4.1   8.5   Elbow Wrist 57    Elbow 7.8   7.8         Ulnar Motor (Abd Dig Min)  31.5C    Wrist 2.8   9.6   B Elbow Wrist 68    B Elbow 5.9   9.4   A Elbow B Elbow 92  A Elbow 7.1   8.7               Clinical History: Cervical Spine MRI  05/05/2016 IMPRESSION: 1. Central/left subarticular C3-C4 disc extrusion with superior migration and mild flattening of the left anterior aspect of the spinal cord. 2. Moderate right C3-C4 neural foraminal stenosis caused by uncovertebral osteophyte. 3. Mild left C6-C7 neural foraminal stenosis secondary to disc osteophyte complex.  She reports that she has quit smoking. She has never used smokeless tobacco. No results for input(s): HGBA1C, LABURIC in the last 8760 hours.  Objective:  VS:  HT:    WT:   BMI:     BP:   HR: bpm  TEMP: ( )  RESP:  Physical Exam  Musculoskeletal:  Inspection reveals no atrophy of the bilateral APB or FDI or hand intrinsics. There is no swelling, color changes, allodynia or dystrophic changes. There is 5 out of 5 strength in the bilateral wrist extension, finger abduction and long finger flexion. There is impaired sensation to light touch predominantly in the C6 dermatome on the left  There is a negative Tinel's test at the bilateral wrist and elbow. There is a negative Hoffmann's test bilaterally.    Ortho Exam Imaging: No results found.  Past Medical/Family/Surgical/Social History: Medications & Allergies reviewed per EMR Patient Active Problem List   Diagnosis Date Noted  . PCP NOTES >>>>>>>>>>>>>>>>>>>>> 05/13/2015  . Menopausal and postmenopausal disorder 04/21/2014  . Annual physical exam 12/21/2010  . UNSPECIFIED PTOSIS OF EYELID 03/17/2009  . Trigger finger, acquired 12/08/2008  . SARCOIDOSIS, PULMONARY 09/20/2006   Past Medical History:  Diagnosis Date  . Allergy   . Asthma   . Hallux valgus of right foot    see's Derek Jack, DPM  . Hammer toe of right foot    see's Derek Jack, DPM  . Menopause    G1 P1  . Sarcoidosis    age 54  . Syncope 03-2009   saw cards, all  tests normal  . Trigger middle finger of right hand    hx of  . Trigger ring finger of right hand 2017   Family History  Problem Relation Age of Onset  . Pancreatic cancer Father   . Breast cancer Mother     dx in her late 63s  . Heart murmur Mother   . Atrial fibrillation Mother   . Colon cancer Other     Maternal aunts x 2 (age of sx in her 62, 62s)  . Diabetes Other     MATERNAL side  . Hypertension Other     MATERNAL side  . Rectal cancer Brother   . Heart attack Neg Hx   . Esophageal cancer Neg Hx   . Stomach cancer Neg Hx    Past Surgical History:  Procedure Laterality Date  . CESAREAN SECTION  12/1977   x1  . LAPAROSCOPY  08/1982  . TOTAL ABDOMINAL HYSTERECTOMY  06/1984   Social History   Occupational History  . Retired, Dispensing optician     Social History Main Topics  . Smoking status: Former Research scientist (life sciences)  . Smokeless tobacco: Never Used     Comment: quit in the 70s, smoked < 1 ppd   . Alcohol use 0.0 oz/week     Comment: Occasionally  . Drug use: No  . Sexual activity: Not on file

## 2016-05-09 NOTE — Procedures (Signed)
EMG & NCV Findings: Evaluation of the left median (across palm) sensory nerve showed prolonged distal peak latency (Wrist, 4.2 ms).  All remaining nerves (as indicated in the following tables) were within normal limits.    All examined muscles (as indicated in the following table) showed no evidence of electrical instability.    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   **However, clinically, I do agree with Dr. Marlou Sa at this seems to be more related to a cervical radiculopathy/ radiculitis. Cervical MRI shows left-sided herniated disc and lateral recess stenosis  At C 3-4 which can still cause more of a C6 radiculitis symptoms. As you know, this particular electrodiagnostic study cannot rule out chemical radiculitis or sensory only radiculopathy.  Recommendations: 1.  Follow-up with referring physician. 2.  Continue current management of symptoms, consider diagnostic carpal tunnel injection.  Her symptoms are also mild enough without weakness that she might benefit from watchful waiting with likely resolution of the cervical herniated disc with time.    Nerve Conduction Studies Anti Sensory Summary Table   Stim Site NR Peak (ms) Norm Peak (ms) P-T Amp (V) Norm P-T Amp Site1 Site2 Delta-P (ms) Dist (cm) Vel (m/s) Norm Vel (m/s)  Left Median Acr Palm Anti Sensory (2nd Digit)  31.9C  Wrist    *4.2 <3.6 18.3 >10 Wrist Palm 2.4 0.0    Palm    1.8 <2.0 31.7         Left Radial Anti Sensory (Base 1st Digit)  31.3C  Wrist    2.0 <3.1 26.5  Wrist Base 1st Digit 2.0 0.0    Left Ulnar Anti Sensory (5th Digit)  31.7C  Wrist    3.1 <3.7 32.9 >15.0 Wrist 5th Digit 3.1 14.0 45 >38   Motor Summary Table   Stim Site NR Onset (ms) Norm Onset (ms) O-P Amp (mV) Norm O-P Amp Site1 Site2 Delta-0 (ms) Dist (cm) Vel (m/s) Norm Vel (m/s)  Left Median Motor (Abd  Poll Brev)  31.6C  Wrist    4.1 <4.2 8.5 >5 Elbow Wrist 3.7 21.0 57 >50  Elbow    7.8  7.8         Left Ulnar Motor (Abd Dig Min)  31.5C  Wrist    2.8 <4.2 9.6 >3 B Elbow Wrist 3.1 21.0 68 >53  B Elbow    5.9  9.4  A Elbow B Elbow 1.2 11.0 92 >53  A Elbow    7.1  8.7          EMG   Side Muscle Nerve Root Ins Act Fibs Psw Amp Dur Poly Recrt Int Fraser Din Comment  Left 1stDorInt Ulnar C8-T1 Nml Nml Nml Nml Nml 0 Nml Nml   Left Abd Poll Brev Median C8-T1 Nml Nml Nml Nml Nml 0 Nml Nml   Left ExtDigCom   Nml Nml Nml Nml Nml 0 Nml Nml   Left Triceps Radial C6-7-8 Nml Nml Nml Nml Nml 0 Nml Nml   Left Deltoid Axillary C5-6 Nml Nml Nml Nml Nml 0 Nml Nml     Nerve Conduction Studies Anti Sensory Left/Right Comparison   Stim Site L Lat (ms) R Lat (ms) L-R Lat (ms) L Amp (V) R Amp (V) L-R Amp (%) Site1 Site2 L Vel (m/s) R Vel (m/s) L-R Vel (m/s)  Median Acr Palm Anti Sensory (2nd Digit)  31.9C  Wrist *4.2  18.3   Wrist Palm     Palm 1.8   31.7         Radial Anti Sensory (Base 1st Digit)  31.3C  Wrist 2.0   26.5   Wrist Base 1st Digit     Ulnar Anti Sensory (5th Digit)  31.7C  Wrist 3.1   32.9   Wrist 5th Digit 45     Motor Left/Right Comparison   Stim Site L Lat (ms) R Lat (ms) L-R Lat (ms) L Amp (mV) R Amp (mV) L-R Amp (%) Site1 Site2 L Vel (m/s) R Vel (m/s) L-R Vel (m/s)  Median Motor (Abd Poll Brev)  31.6C  Wrist 4.1   8.5   Elbow Wrist 57    Elbow 7.8   7.8         Ulnar Motor (Abd Dig Min)  31.5C  Wrist 2.8   9.6   B Elbow Wrist 68    B Elbow 5.9   9.4   A Elbow B Elbow 92    A Elbow 7.1   8.7

## 2016-05-11 ENCOUNTER — Ambulatory Visit (INDEPENDENT_AMBULATORY_CARE_PROVIDER_SITE_OTHER): Payer: Medicare Other | Admitting: Orthopedic Surgery

## 2016-05-11 ENCOUNTER — Encounter (INDEPENDENT_AMBULATORY_CARE_PROVIDER_SITE_OTHER): Payer: Self-pay | Admitting: Orthopedic Surgery

## 2016-05-11 DIAGNOSIS — R200 Anesthesia of skin: Secondary | ICD-10-CM

## 2016-05-11 DIAGNOSIS — M4802 Spinal stenosis, cervical region: Secondary | ICD-10-CM | POA: Diagnosis not present

## 2016-05-11 DIAGNOSIS — G5602 Carpal tunnel syndrome, left upper limb: Secondary | ICD-10-CM | POA: Diagnosis not present

## 2016-05-11 DIAGNOSIS — M542 Cervicalgia: Secondary | ICD-10-CM | POA: Diagnosis not present

## 2016-05-11 DIAGNOSIS — R202 Paresthesia of skin: Secondary | ICD-10-CM

## 2016-05-11 NOTE — Progress Notes (Signed)
Office Visit Note   Patient: Caitlin Mccarty           Date of Birth: Apr 01, 1950           MRN: [id number] Visit Date: 05/11/2016 Requested by: Colon Branch, MD [address] [place], [place] [address] PCP: Kathlene November, MD  Subjective: Chief Complaint  Patient presents with  . Neck - Follow-up    HPI: Patient is a 66 year old female here to review MRI of cervical spine as well as nerve conduction.  Nerve conduction demonstrated mild carpal tunnel syndrome on the left.  MRI scan shows C3-4 herniated disc primarily on the left along with moderate right-sided C4-5 foraminal stenosis as well.  She states that when she's getting her hair washed at the hairdresser she has some pain which radiates down the left arm.  Currently not taking medication for this problem.  She does describe constant numbness and tingling in both hands.              ROS: All systems reviewed are negative as they relate to the chief complaint within the history of present illness.  Patient denies  fevers or chills.   Assessment & Plan: Visit Diagnoses:  1. Spinal stenosis of cervical region   2. Cervicalgia     Plan: Impression is mild carpal tunnel syndrome on the left and to potential levels of cervical spine involvement giving her a radicular symptoms this is at C3-4 on the left and C4-5 on the right.  If these become more symptomatic then cervical spine injections.  I'll get her set up with afternoon for that.  In the meantime to try her with a wrist splint for that left hand to be worn at night.  We'll see her back as needed.  Follow-Up Instructions: No Follow-up on file.   Orders:  No orders of the defined types were placed in this encounter.  No orders of the defined types were placed in this encounter.     Procedures: No procedures performed   Clinical Data: No additional findings.  Objective: Vital Signs: There were no vitals taken for this visit.  Physical Exam:   Constitutional:  Patient appears well-developed HEENT:  Head: Normocephalic Eyes:EOM are normal Neck: Normal range of motion Cardiovascular: Normal rate Pulmonary/chest: Effort normal Neurologic: Patient is alert Skin: Skin is warm Psychiatric: Patient has normal mood and affect    Ortho Exam: Orthopedic exam demonstrates some reproduction of symptoms with extension of the neck.  The symptoms get around the left arm.  Patient has 5 out of 5 grip EPL FPL interosseous wrist flexion and wrist extension biceps triceps and deltoid strength.  Patient has palpable radial pulses and no definite paresthesias in the C5-6 distribution.  Specialty Comments:  No specialty comments available.  Imaging: No results found.   PMFS History: Patient Active Problem List   Diagnosis Date Noted  . PCP NOTES >>>>>>>>>>>>>>>>>>>>> 05/13/2015  . Menopausal and postmenopausal disorder 04/21/2014  . Annual physical exam 12/21/2010  . UNSPECIFIED PTOSIS OF EYELID 03/17/2009  . Trigger finger, acquired 12/08/2008  . SARCOIDOSIS, PULMONARY 09/20/2006   Past Medical History:  Diagnosis Date  . Allergy   . Asthma   . Hallux valgus of right foot    see's Derek Jack, DPM  . Hammer toe of right foot    see's Derek Jack, DPM  . Menopause    G1 P1  . Sarcoidosis    age 66  . Syncope 03-2009  saw cards, all tests normal  . Trigger middle finger of right hand    hx of  . Trigger ring finger of right hand 2017    Family History  Problem Relation Age of Onset  . Pancreatic cancer Father   . Breast cancer Mother     dx in her late 94s  . Heart murmur Mother   . Atrial fibrillation Mother   . Colon cancer Other     Maternal aunts x 2 (age of sx in her 54, 54s)  . Diabetes Other     MATERNAL side  . Hypertension Other     MATERNAL side  . Rectal cancer Brother   . Heart attack Neg Hx   . Esophageal cancer Neg Hx   . Stomach cancer Neg Hx     Past Surgical History:  Procedure Laterality Date  .  CESAREAN SECTION  12/1977   x1  . LAPAROSCOPY  08/1982  . TOTAL ABDOMINAL HYSTERECTOMY  06/1984   Social History   Occupational History  . Retired, Dispensing optician     Social History Main Topics  . Smoking status: Former Research scientist (life sciences)  . Smokeless tobacco: Never Used     Comment: quit in the 70s, smoked < 1 ppd   . Alcohol use 0.0 oz/week     Comment: Occasionally  . Drug use: No  . Sexual activity: Not on file

## 2016-05-13 ENCOUNTER — Ambulatory Visit: Payer: Medicare Other | Admitting: *Deleted

## 2016-06-16 NOTE — Progress Notes (Addendum)
Subjective:   Caitlin Mccarty is a 66 y.o. female who presents for Medicare Annual (Subsequent) preventive examination.  Review of Systems:  No ROS.  Medicare Wellness Visit.  Cardiac Risk Factors include: advanced age (>65men, >108 women);obesity (BMI >30kg/m2);sedentary lifestyle  Sleep patterns: feels rested on waking, gets up 1 times nightly to void and sleeps 6-7 hours nightly. Wakes up between 3am-5am to void and has a hard time falling back asleep, so will occassionally take a daytime nap. Home Safety/Smoke Alarms: Feels safe in home. Smoke alarms in place.  Living environment; residence and Firearm Safety: Lives alone in [place]. 1-story house/ trailer, no firearms. Seat Belt Safety/Bike Helmet: Wears seat belt.   Counseling:   Eye Exam- Follows w/ Dr. Bing Plume. Dental- Follows w/ Dr. Junita Push. Is planning to start seeing [facility] for dental needs.  Female:   Pap- N/A, hysterectomy      Mammo- last 07/02/15. BI-RADS CATEGORY  1: Negative. Dexa scan- last 05/18/15. Normal.         CCS- last 02/01/11. Normal. 10 year recall.     Objective:     Vitals: BP 128/70   Pulse 66   Ht 5\' 4"  (1.626 m)   Wt 217 lb 6.4 oz (98.6 kg)   SpO2 98%   BMI 37.32 kg/m   Body mass index is 37.32 kg/m.  Wt Readings from Last 3 Encounters:  06/17/16 217 lb 6.4 oz (98.6 kg)  05/13/15 216 lb 8 oz (98.2 kg)  02/27/14 215 lb 8 oz (97.8 kg)    Tobacco History  Smoking Status  . Former Smoker  Smokeless Tobacco  . Never Used    Comment: quit in the 70s, smoked < 1 ppd      Counseling given: Not Answered   Past Medical History:  Diagnosis Date  . Allergy   . Asthma   . Hallux valgus of right foot    see's Derek Jack, DPM  . Hammer toe of right foot    see's Derek Jack, DPM  . Menopause    G1 P1  . Sarcoidosis    age 62  . Syncope 03-2009   saw cards, all tests normal  . Trigger middle finger of right hand    hx of  . Trigger ring finger of right hand 2017    Past Surgical History:  Procedure Laterality Date  . CESAREAN SECTION  12/1977   x1  . LAPAROSCOPY  08/1982  . TOTAL ABDOMINAL HYSTERECTOMY  06/1984   Family History  Problem Relation Age of Onset  . Pancreatic cancer Father   . Breast cancer Mother        dx in her late 61s  . Heart murmur Mother   . Atrial fibrillation Mother   . Hypertension Mother   . Hyperlipidemia Mother   . Colon cancer Other        Maternal aunts x 2 (age of sx in her 25, 1s)  . Diabetes Other        MATERNAL side  . Hypertension Other        MATERNAL side  . Rectal cancer Brother   . Hypertension Sister   . Heart attack Neg Hx   . Esophageal cancer Neg Hx   . Stomach cancer Neg Hx    History  Sexual Activity  . Sexual activity: Yes    Outpatient Encounter Prescriptions as of 06/17/2016  Medication Sig  . azelastine (ASTELIN) 0.1 % nasal spray Place 2 sprays into both  nostrils at bedtime as needed for rhinitis. Use in each nostril as directed  . b complex vitamins capsule Take 1 capsule by mouth daily.    . COD LIVER OIL PO 1 capsule daily.   Marland Kitchen estradiol (ESTRACE) 0.5 MG tablet Take 0.5 mg by mouth daily.  . fluticasone (FLONASE) 50 MCG/ACT nasal spray Place 1 spray into both nostrils daily as needed for allergies or rhinitis.  Marland Kitchen loratadine (ALAVERT) 10 MG tablet Take 10 mg by mouth daily as needed for allergies.  . Multiple Vitamin (MULTIVITAMIN) tablet Take by mouth daily.   . vitamin E 100 UNIT capsule Take 100 Units by mouth daily.     No facility-administered encounter medications on file as of 06/17/2016.     Activities of Daily Living In your present state of health, do you have any difficulty performing the following activities: 06/17/2016  Hearing? N  Vision? N  Difficulty concentrating or making decisions? N  Walking or climbing stairs? N  Dressing or bathing? N  Doing errands, shopping? N  Preparing Food and eating ? N  Using the Toilet? N  In the past six months, have you  accidently leaked urine? N  Do you have problems with loss of bowel control? N  Managing your Medications? N  Managing your Finances? N  Housekeeping or managing your Housekeeping? N  Some recent data might be hidden    Patient Care Team: Colon Branch, MD as PCP - General Marlou Sa, Tonna Corner, MD as Consulting Physician (Orthopedic Surgery) Meredith Pel, MD as Consulting Physician (Orthopedic Surgery) Karl Luke, MD as Consulting Physician (Optometry) Vania Rea, MD as Consulting Physician (Obstetrics and Gynecology)    Assessment:    Physical assessment deferred to PCP.  Exercise Activities and Dietary recommendations Current Exercise Habits: The patient does not participate in regular exercise at present, Exercise limited by: None identified  Diet (meal preparation, eat out, water intake, caffeinated beverages, dairy products, fruits and vegetables): in general, a "healthy" diet  , well balanced, on average, 3 meals per day. 'I do try to get my vegetables in.' 2 16 oz bottles of water daily. Trying to cut back on snacks/sweets. Breakfast: coffee, cereal and OJ 4 days weekly; bacon and eggs on the other days Lunch: sandwich Dinner: varies-meat and vegetable and starch; ice cream for dessert  Goals    . Increase water intake      Fall Risk Fall Risk  06/17/2016 05/13/2015  Falls in the past year? No No   Depression Screen PHQ 2/9 Scores 06/17/2016 05/13/2015  PHQ - 2 Score 0 0     Cognitive Function MMSE - Mini Mental State Exam 06/17/2016  Orientation to time 5  Orientation to Place 5  Registration 3  Attention/ Calculation 5  Recall 3  Language- name 2 objects 2  Language- repeat 1  Language- follow 3 step command 3  Language- read & follow direction 1  Write a sentence 1  Copy design 1  Total score 30        Immunization History  Administered Date(s) Administered  . Td 09/20/2006   Screening Tests Health Maintenance  Topic Date Due  . PNA vac Low  Risk Adult (1 of 2 - PCV13) 12/22/2014  . MAMMOGRAM  07/01/2016  . INFLUENZA VACCINE  08/17/2016  . TETANUS/TDAP  09/19/2016  . COLONOSCOPY  01/31/2021  . DEXA SCAN  Completed  . Hepatitis C Screening  Completed      Plan:    Follow-up  w/ PCP as scheduled.   Declines pneumococcal and shingles vaccine.  Create and Bring a copy of your advance directives to your next office visit.  I have personally reviewed and noted the following in the patient's chart:   . Medical and social history . Use of alcohol, tobacco or illicit drugs  . Current medications and supplements . Functional ability and status . Nutritional status . Physical activity . Advanced directives . List of other physicians . Vitals . Screenings to include cognitive, depression, and falls . Referrals and appointments  In addition, I have reviewed and discussed with patient certain preventive protocols, quality metrics, and best practice recommendations. A written personalized care plan for preventive services as well as general preventive health recommendations were provided to patient.     Dorrene German, RN  06/17/2016 Kathlene November, MD

## 2016-06-17 ENCOUNTER — Ambulatory Visit (INDEPENDENT_AMBULATORY_CARE_PROVIDER_SITE_OTHER): Payer: Medicare Other | Admitting: *Deleted

## 2016-06-17 ENCOUNTER — Encounter: Payer: Self-pay | Admitting: *Deleted

## 2016-06-17 VITALS — BP 128/70 | HR 66 | Ht 64.0 in | Wt 217.4 lb

## 2016-06-17 DIAGNOSIS — D229 Melanocytic nevi, unspecified: Secondary | ICD-10-CM

## 2016-06-17 DIAGNOSIS — Z1231 Encounter for screening mammogram for malignant neoplasm of breast: Secondary | ICD-10-CM | POA: Diagnosis not present

## 2016-06-17 DIAGNOSIS — Z1239 Encounter for other screening for malignant neoplasm of breast: Secondary | ICD-10-CM

## 2016-06-17 DIAGNOSIS — Z0000 Encounter for general adult medical examination without abnormal findings: Secondary | ICD-10-CM | POA: Diagnosis not present

## 2016-06-17 DIAGNOSIS — N9089 Other specified noninflammatory disorders of vulva and perineum: Secondary | ICD-10-CM | POA: Insufficient documentation

## 2016-06-17 NOTE — Patient Instructions (Addendum)
[name] , Thank you for taking time to come for your Medicare Wellness Visit. I appreciate your ongoing commitment to your health goals. Please review the following plan we discussed and let me know if I can assist you in the future.   Create and Bring a copy of your advance directives to your next office visit.  These are the goals we discussed: Goals    . Healthy Lifestyle          Eat heart healthy diet (full of fruits, vegetables, whole grains, lean protein, water--limit salt, fat, and sugar intake) and increase physical activity as tolerated. Continue doing brain stimulating activities (puzzles, reading, adult coloring books, staying active) to keep memory sharp.     . Increase physical activity       This is a list of the screening recommended for you and due dates:  Health Maintenance  Topic Date Due  . Pneumonia vaccines (1 of 2 - PCV13) 12/22/2014  . Mammogram  07/01/2016  . Flu Shot  08/17/2016  . Tetanus Vaccine  09/19/2016  . Colon Cancer Screening  01/31/2021  . DEXA scan (bone density measurement)  Completed  .  Hepatitis C: One time screening is recommended by Center for Disease Control  (CDC) for  adults born from [birth year] through 1966.   Completed    Health Maintenance, Female Adopting a healthy lifestyle and getting preventive care can go a long way to promote health and wellness. Talk with your health care provider about what schedule of regular examinations is right for you. This is a good chance for you to check in with your provider about disease prevention and staying healthy. In between checkups, there are plenty of things you can do on your own. Experts have done a lot of research about which lifestyle changes and preventive measures are most likely to keep you healthy. Ask your health care provider for more information. Weight and diet Eat a healthy diet  Be sure to include plenty of vegetables, fruits, low-fat dairy products, and lean protein.  Do not  eat a lot of foods high in solid fats, added sugars, or salt.  Get regular exercise. This is one of the most important things you can do for your health. ? Most adults should exercise for at least 150 minutes each week. The exercise should increase your heart rate and make you sweat (moderate-intensity exercise). ? Most adults should also do strengthening exercises at least twice a week. This is in addition to the moderate-intensity exercise.  Maintain a healthy weight  Body mass index (BMI) is a measurement that can be used to identify possible weight problems. It estimates body fat based on height and weight. Your health care provider can help determine your BMI and help you achieve or maintain a healthy weight.  For females 14 years of age and older: ? A BMI below 18.5 is considered underweight. ? A BMI of 18.5 to 24.9 is normal. ? A BMI of 25 to 29.9 is considered overweight. ? A BMI of 30 and above is considered obese.  Watch levels of cholesterol and blood lipids  You should start having your blood tested for lipids and cholesterol at 67 years of age, then have this test every 5 years.  You may need to have your cholesterol levels checked more often if: ? Your lipid or cholesterol levels are high. ? You are older than 68 years of age. ? You are at high risk for heart disease.  Cancer  screening Lung Cancer  Lung cancer screening is recommended for adults 9-72 years old who are at high risk for lung cancer because of a history of smoking.  A yearly low-dose CT scan of the lungs is recommended for people who: ? Currently smoke. ? Have quit within the past 15 years. ? Have at least a 30-pack-year history of smoking. A pack year is smoking an average of one pack of cigarettes a day for 1 year.  Yearly screening should continue until it has been 15 years since you quit.  Yearly screening should stop if you develop a health problem that would prevent you from having lung cancer  treatment.  Breast Cancer  Practice breast self-awareness. This means understanding how your breasts normally appear and feel.  It also means doing regular breast self-exams. Let your health care provider know about any changes, no matter how small.  If you are in your 20s or 30s, you should have a clinical breast exam (CBE) by a health care provider every 1-3 years as part of a regular health exam.  If you are 56 or older, have a CBE every year. Also consider having a breast X-ray (mammogram) every year.  If you have a family history of breast cancer, talk to your health care provider about genetic screening.  If you are at high risk for breast cancer, talk to your health care provider about having an MRI and a mammogram every year.  Breast cancer gene (BRCA) assessment is recommended for women who have family members with BRCA-related cancers. BRCA-related cancers include: ? Breast. ? Ovarian. ? Tubal. ? Peritoneal cancers.  Results of the assessment will determine the need for genetic counseling and BRCA1 and BRCA2 testing.  Cervical Cancer Your health care provider may recommend that you be screened regularly for cancer of the pelvic organs (ovaries, uterus, and vagina). This screening involves a pelvic examination, including checking for microscopic changes to the surface of your cervix (Pap test). You may be encouraged to have this screening done every 3 years, beginning at age 29.  For women ages 30-65, health care providers may recommend pelvic exams and Pap testing every 3 years, or they may recommend the Pap and pelvic exam, combined with testing for human papilloma virus (HPV), every 5 years. Some types of HPV increase your risk of cervical cancer. Testing for HPV may also be done on women of any age with unclear Pap test results.  Other health care providers may not recommend any screening for nonpregnant women who are considered low risk for pelvic cancer and who do not have  symptoms. Ask your health care provider if a screening pelvic exam is right for you.  If you have had past treatment for cervical cancer or a condition that could lead to cancer, you need Pap tests and screening for cancer for at least 20 years after your treatment. If Pap tests have been discontinued, your risk factors (such as having a new sexual partner) need to be reassessed to determine if screening should resume. Some women have medical problems that increase the chance of getting cervical cancer. In these cases, your health care provider may recommend more frequent screening and Pap tests.  Colorectal Cancer  This type of cancer can be detected and often prevented.  Routine colorectal cancer screening usually begins at 67 years of age and continues through 67 years of age.  Your health care provider may recommend screening at an earlier age if you have risk factors for  colon cancer.  Your health care provider may also recommend using home test kits to check for hidden blood in the stool.  A small camera at the end of a tube can be used to examine your colon directly (sigmoidoscopy or colonoscopy). This is done to check for the earliest forms of colorectal cancer.  Routine screening usually begins at age 28.  Direct examination of the colon should be repeated every 5-10 years through 67 years of age. However, you may need to be screened more often if early forms of precancerous polyps or small growths are found.  Skin Cancer  Check your skin from head to toe regularly.  Tell your health care provider about any new moles or changes in moles, especially if there is a change in a mole's shape or color.  Also tell your health care provider if you have a mole that is larger than the size of a pencil eraser.  Always use sunscreen. Apply sunscreen liberally and repeatedly throughout the day.  Protect yourself by wearing long sleeves, pants, a wide-brimmed hat, and sunglasses whenever you  are outside.  Heart disease, diabetes, and high blood pressure  High blood pressure causes heart disease and increases the risk of stroke. High blood pressure is more likely to develop in: ? People who have blood pressure in the high end of the normal range (130-139/85-89 mm Hg). ? People who are overweight or obese. ? People who are African American.  If you are 28-103 years of age, have your blood pressure checked every 3-5 years. If you are 46 years of age or older, have your blood pressure checked every year. You should have your blood pressure measured twice-once when you are at a hospital or clinic, and once when you are not at a hospital or clinic. Record the average of the two measurements. To check your blood pressure when you are not at a hospital or clinic, you can use: ? An automated blood pressure machine at a pharmacy. ? A home blood pressure monitor.  If you are between 78 years and 7 years old, ask your health care provider if you should take aspirin to prevent strokes.  Have regular diabetes screenings. This involves taking a blood sample to check your fasting blood sugar level. ? If you are at a normal weight and have a low risk for diabetes, have this test once every three years after 67 years of age. ? If you are overweight and have a high risk for diabetes, consider being tested at a younger age or more often. Preventing infection Hepatitis B  If you have a higher risk for hepatitis B, you should be screened for this virus. You are considered at high risk for hepatitis B if: ? You were born in a country where hepatitis B is common. Ask your health care provider which countries are considered high risk. ? Your parents were born in a high-risk country, and you have not been immunized against hepatitis B (hepatitis B vaccine). ? You have HIV or AIDS. ? You use needles to inject street drugs. ? You live with someone who has hepatitis B. ? You have had sex with someone who  has hepatitis B. ? You get hemodialysis treatment. ? You take certain medicines for conditions, including cancer, organ transplantation, and autoimmune conditions.  Hepatitis C  Blood testing is recommended for: ? Everyone born from [birth year] through 1966. ? Anyone with known risk factors for hepatitis C.  Sexually transmitted infections (STIs)  You  should be screened for sexually transmitted infections (STIs) including gonorrhea and chlamydia if: ? You are sexually active and are younger than 67 years of age. ? You are older than 67 years of age and your health care provider tells you that you are at risk for this type of infection. ? Your sexual activity has changed since you were last screened and you are at an increased risk for chlamydia or gonorrhea. Ask your health care provider if you are at risk.  If you do not have HIV, but are at risk, it may be recommended that you take a prescription medicine daily to prevent HIV infection. This is called pre-exposure prophylaxis (PrEP). You are considered at risk if: ? You are sexually active and do not regularly use condoms or know the HIV status of your partner(s). ? You take drugs by injection. ? You are sexually active with a partner who has HIV.  Talk with your health care provider about whether you are at high risk of being infected with HIV. If you choose to begin PrEP, you should first be tested for HIV. You should then be tested every 3 months for as long as you are taking PrEP. Pregnancy  If you are premenopausal and you may become pregnant, ask your health care provider about preconception counseling.  If you may become pregnant, take 400 to 800 micrograms (mcg) of folic acid every day.  If you want to prevent pregnancy, talk to your health care provider about birth control (contraception). Osteoporosis and menopause  Osteoporosis is a disease in which the bones lose minerals and strength with aging. This can result in serious bone  fractures. Your risk for osteoporosis can be identified using a bone density scan.  If you are 100 years of age or older, or if you are at risk for osteoporosis and fractures, ask your health care provider if you should be screened.  Ask your health care provider whether you should take a calcium or vitamin D supplement to lower your risk for osteoporosis.  Menopause may have certain physical symptoms and risks.  Hormone replacement therapy may reduce some of these symptoms and risks. Talk to your health care provider about whether hormone replacement therapy is right for you. Follow these instructions at home:  Schedule regular health, dental, and eye exams.  Stay current with your immunizations.  Do not use any tobacco products including cigarettes, chewing tobacco, or electronic cigarettes.  If you are pregnant, do not drink alcohol.  If you are breastfeeding, limit how much and how often you drink alcohol.  Limit alcohol intake to no more than 1 drink per day for nonpregnant women. One drink equals 12 ounces of beer, 5 ounces of wine, or 1 ounces of hard liquor.  Do not use street drugs.  Do not share needles.  Ask your health care provider for help if you need support or information about quitting drugs.  Tell your health care provider if you often feel depressed.  Tell your health care provider if you have ever been abused or do not feel safe at home. This information is not intended to replace advice given to you by your health care provider. Make sure you discuss any questions you have with your health care provider. Document Released: 07/19/2010 Document Revised: 06/11/2015 Document Reviewed: 10/07/2014 Elsevier Interactive Patient Education  [employer].

## 2016-06-27 ENCOUNTER — Encounter: Payer: Self-pay | Admitting: Internal Medicine

## 2016-06-27 ENCOUNTER — Ambulatory Visit (INDEPENDENT_AMBULATORY_CARE_PROVIDER_SITE_OTHER): Payer: Medicare Other | Admitting: Internal Medicine

## 2016-06-27 VITALS — BP 124/78 | HR 79 | Temp 97.9°F | Resp 14 | Ht 64.0 in | Wt 218.1 lb

## 2016-06-27 DIAGNOSIS — R739 Hyperglycemia, unspecified: Secondary | ICD-10-CM | POA: Diagnosis not present

## 2016-06-27 DIAGNOSIS — Z0000 Encounter for general adult medical examination without abnormal findings: Secondary | ICD-10-CM | POA: Diagnosis not present

## 2016-06-27 DIAGNOSIS — Z23 Encounter for immunization: Secondary | ICD-10-CM

## 2016-06-27 DIAGNOSIS — E785 Hyperlipidemia, unspecified: Secondary | ICD-10-CM | POA: Diagnosis not present

## 2016-06-27 NOTE — Patient Instructions (Addendum)
GO TO THE LAB : Get the blood work  Architectural technologist, fasting   GO TO [place] Schedule your next appointment for a  routine checkup with me in one year.

## 2016-06-27 NOTE — Progress Notes (Signed)
Pre visit review using our clinic review tool, if applicable. No additional management support is needed unless otherwise documented below in the visit note. 

## 2016-06-27 NOTE — Assessment & Plan Note (Addendum)
--   Td booster  06-2016; PNM, shingles shot discussed today:  not interested --female care:  Sees gyn, last OV 02-2016 Dr Stann Mainland. Last MMG 06-2015   DEXA 05-2015 wnl --+ FH colon cancer, colonoscopy in 2007 (in [place]) , reportedly negative; Cscope  Dr. Henrene Pastor, 01-2011 negative, next in 10 years --Labs : CMP, FLP, A1c --Diet , exercise discussed

## 2016-06-27 NOTE — Progress Notes (Signed)
Subjective:    Patient ID: Caitlin Mccarty, female    DOB: [birth year], 66 y.o.   MRN: [id number]  DOS:  06/27/2016 Type of visit - description : rov Interval history: No major concerns, allergies well controlled, saw orthopedic doctor, had a nerve conduction study. Notes reviewed.   Review of Systems Some anxiety, patient controls it well. Related to family issues. No chest pain or difficulty breathing No nausea, vomiting, diarrhea.   Past Medical History:  Diagnosis Date  . Allergy   . Asthma   . Hallux valgus of right foot    see's Derek Jack, DPM  . Hammer toe of right foot    see's Derek Jack, DPM  . Menopause    G1 P1  . Sarcoidosis    age 6  . Syncope 03-2009   saw cards, all tests normal  . Trigger middle finger of right hand    hx of  . Trigger ring finger of right hand 2017    Past Surgical History:  Procedure Laterality Date  . CESAREAN SECTION  12/1977   x1  . LAPAROSCOPY  08/1982  . TOTAL ABDOMINAL HYSTERECTOMY  06/1984    Social History   Social History  . Marital status: Divorced    Spouse name: N/A  . Number of children: 1  . Years of education: N/A   Occupational History  . Retired, Dispensing optician     Social History Main Topics  . Smoking status: Former Research scientist (life sciences)  . Smokeless tobacco: Never Used     Comment: quit in the 70s, smoked < 1 ppd   . Alcohol use 1.8 - 2.4 oz/week    3 - 4 Glasses of wine per week     Comment: Occasionally  . Drug use: No  . Sexual activity: Yes   Other Topics Concern  . Not on file   Social History Narrative   Lives by herself , daughter lives in [place]   Mother is 33, lives in [place], pt visits once a month      Allergies as of 06/27/2016   No Known Allergies     Medication List       Accurate as of 06/27/16  3:24 PM. Always use your most recent med list.          ALAVERT 10 MG tablet Generic drug:  loratadine Take 10 mg by mouth daily as needed for allergies.   azelastine 0.1 % nasal  spray Commonly known as:  ASTELIN Place 2 sprays into both nostrils at bedtime as needed for rhinitis. Use in each nostril as directed   b complex vitamins capsule Take 1 capsule by mouth daily.   COD LIVER OIL PO 1 capsule daily.   estradiol 0.5 MG tablet Commonly known as:  ESTRACE Take 0.5 mg by mouth daily.   fluticasone 50 MCG/ACT nasal spray Commonly known as:  FLONASE Place 1 spray into both nostrils daily as needed for allergies or rhinitis.   multivitamin tablet Take by mouth daily.   vitamin E 100 UNIT capsule Take 100 Units by mouth daily.          Objective:   Physical Exam BP 124/78 (BP Location: Left Arm, Patient Position: Sitting, Cuff Size: Normal)   Pulse 79   Temp 97.9 F (36.6 C) (Oral)   Resp 14   Ht 5\' 4"  (1.626 m)   Wt 218 lb 2 oz (98.9 kg)   SpO2 97%   BMI 37.44 kg/m   General:  Well developed, well nourished . NAD.  Neck: No  thyromegaly  HEENT:  Normocephalic . Face symmetric, atraumatic Lungs:  CTA B Normal respiratory effort, no intercostal retractions, no accessory muscle use. Heart: RRR,  no murmur.  No pretibial edema bilaterally  Abdomen:  Not distended, soft, non-tender. No rebound or rigidity.   Skin: Exposed areas without rash. Not pale. Not jaundice Neurologic:  alert & oriented X3.  Speech normal, gait appropriate for age and unassisted Strength symmetric and appropriate for age.  Psych: Cognition and judgment appear intact.  Cooperative with normal attention span and concentration.  Behavior appropriate. No anxious or depressed appearing.    Assessment & Plan:   Assessment Allergies Menopausal- on HRT H/o Sarcoidosis DX age  71 H/o Syncope, 2011, saw cards w/u (-)  PLAN: Allergies: Currently well controlled Hyperglycemia: Mild, see labs from last year, check a CMP, FLP, A1c Dyslipidemia: Back in 2014 HDL was low. Will check a FLP CTS: Saw ortho 04-2016, w/ paresthesias, left hand. Had a cervical spine MRI  and NCS : NCS showed L CTS. Currently using a wrist splinter and feeling better. RTC one year

## 2016-06-28 ENCOUNTER — Other Ambulatory Visit (INDEPENDENT_AMBULATORY_CARE_PROVIDER_SITE_OTHER): Payer: Medicare Other

## 2016-06-28 DIAGNOSIS — E785 Hyperlipidemia, unspecified: Secondary | ICD-10-CM

## 2016-06-28 DIAGNOSIS — R739 Hyperglycemia, unspecified: Secondary | ICD-10-CM | POA: Diagnosis not present

## 2016-06-28 LAB — COMPREHENSIVE METABOLIC PANEL
ALBUMIN: 3.9 g/dL (ref 3.5–5.2)
ALT: 15 U/L (ref 0–35)
AST: 15 U/L (ref 0–37)
Alkaline Phosphatase: 50 U/L (ref 39–117)
BILIRUBIN TOTAL: 0.7 mg/dL (ref 0.2–1.2)
BUN: 12 mg/dL (ref 6–23)
CALCIUM: 9.1 mg/dL (ref 8.4–10.5)
CO2: 31 meq/L (ref 19–32)
CREATININE: 0.76 mg/dL (ref 0.40–1.20)
Chloride: 104 mEq/L (ref 96–112)
GFR: 97.77 mL/min (ref >60.00)
Glucose, Bld: 108 mg/dL — ABNORMAL HIGH (ref 70–99)
Potassium: 4 mEq/L (ref 3.5–5.1)
Sodium: 138 mEq/L (ref 135–145)
TOTAL PROTEIN: 7 g/dL (ref 6.0–8.3)

## 2016-06-28 LAB — LIPID PANEL
CHOLESTEROL: 176 mg/dL (ref 0–200)
HDL: 48.1 mg/dL (ref >39.00)
LDL CALC: 108 mg/dL — AB (ref 0–99)
NonHDL: 127.72
Total CHOL/HDL Ratio: 4
Triglycerides: 100 mg/dL (ref 0.0–149.0)
VLDL: 20 mg/dL (ref 0.0–40.0)

## 2016-06-28 LAB — HEMOGLOBIN A1C: HEMOGLOBIN A1C: 6.2 % (ref 4.6–6.5)

## 2016-06-28 NOTE — Assessment & Plan Note (Signed)
Allergies: Currently well controlled Hyperglycemia: Mild, see labs from last year, check a CMP, FLP, A1c Dyslipidemia: Back in 2014 HDL was low. Will check a FLP CTS: Saw ortho 04-2016, w/ paresthesias, left hand. Had a cervical spine MRI and NCS : NCS showed L CTS. Currently using a wrist splinter and feeling better. RTC one year

## 2016-07-08 ENCOUNTER — Ambulatory Visit (HOSPITAL_BASED_OUTPATIENT_CLINIC_OR_DEPARTMENT_OTHER)
Admission: RE | Admit: 2016-07-08 | Discharge: 2016-07-08 | Disposition: A | Discharge status: Home / Self Care | Payer: Medicare Other | Source: Ambulatory Visit | Attending: Internal Medicine | Admitting: Internal Medicine

## 2016-07-08 ENCOUNTER — Other Ambulatory Visit: Payer: Self-pay | Admitting: Internal Medicine

## 2016-07-08 DIAGNOSIS — Z1231 Encounter for screening mammogram for malignant neoplasm of breast: Secondary | ICD-10-CM | POA: Diagnosis not present

## 2016-07-08 DIAGNOSIS — Z1239 Encounter for other screening for malignant neoplasm of breast: Secondary | ICD-10-CM

## 2016-08-05 ENCOUNTER — Other Ambulatory Visit: Payer: Self-pay

## 2016-09-22 DIAGNOSIS — L820 Inflamed seborrheic keratosis: Secondary | ICD-10-CM | POA: Diagnosis not present

## 2017-03-07 DIAGNOSIS — Z6837 Body mass index (BMI) 37.0-37.9, adult: Secondary | ICD-10-CM | POA: Diagnosis not present

## 2017-03-07 DIAGNOSIS — N951 Menopausal and female climacteric states: Secondary | ICD-10-CM | POA: Diagnosis not present

## 2017-04-19 DIAGNOSIS — H2513 Age-related nuclear cataract, bilateral: Secondary | ICD-10-CM | POA: Diagnosis not present

## 2017-04-19 DIAGNOSIS — H524 Presbyopia: Secondary | ICD-10-CM | POA: Diagnosis not present

## 2017-08-11 IMAGING — MG 2D DIGITAL SCREENING BILATERAL MAMMOGRAM WITH CAD AND ADJUNCT TO
6 of 9 series · 6 of 25 positions shown · non-contrast
Comparison: Previous exam(s).

CLINICAL DATA: Screening.

EXAM:
2D DIGITAL SCREENING BILATERAL MAMMOGRAM WITH CAD AND ADJUNCT TOMO

[R XCCL]
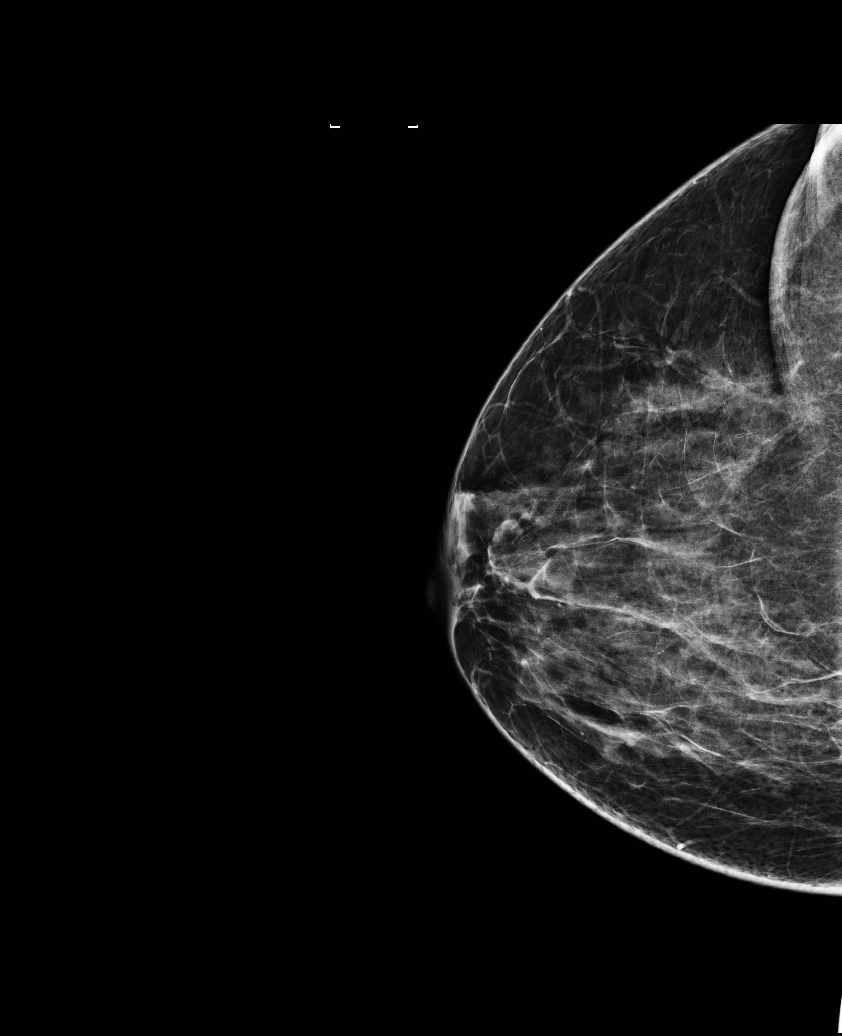

[L MLO]
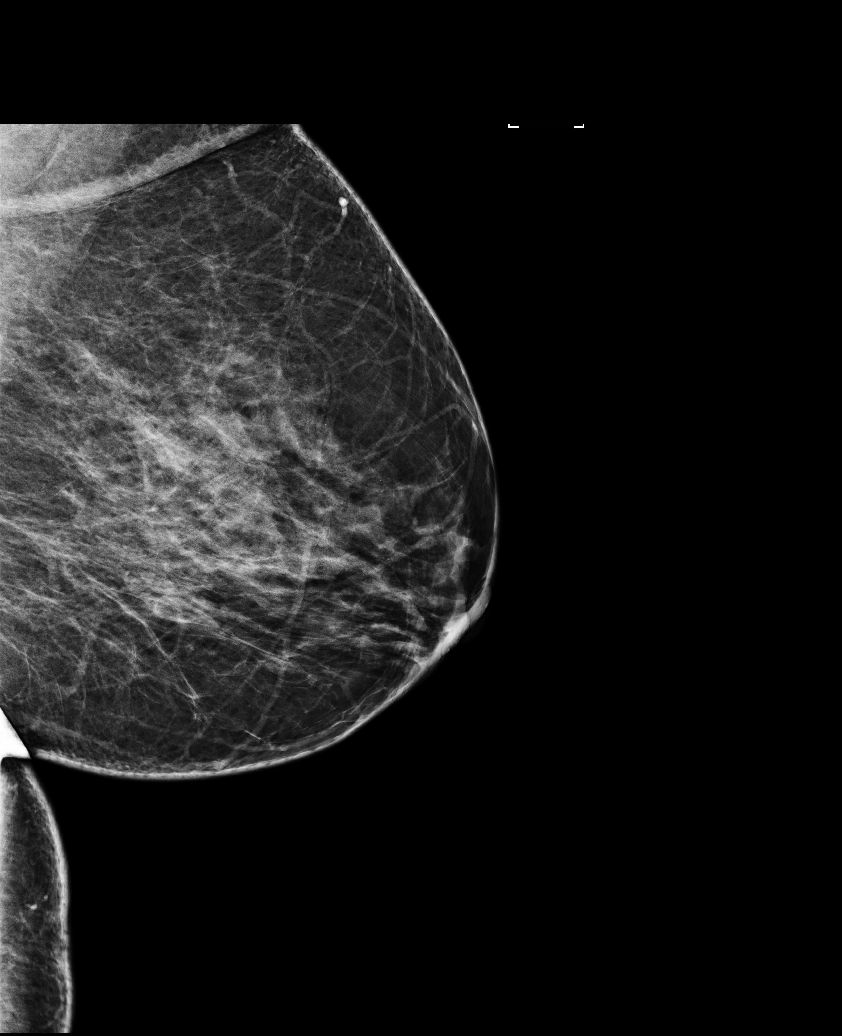

[R MLO]
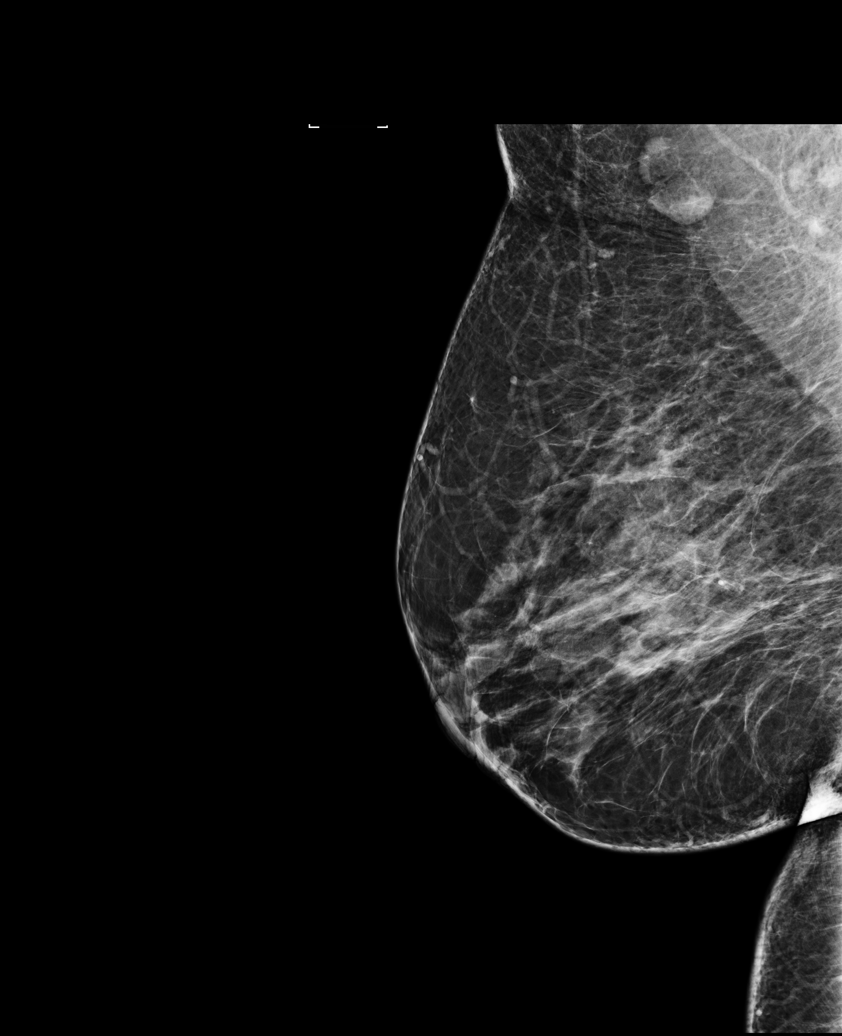

[L CC]
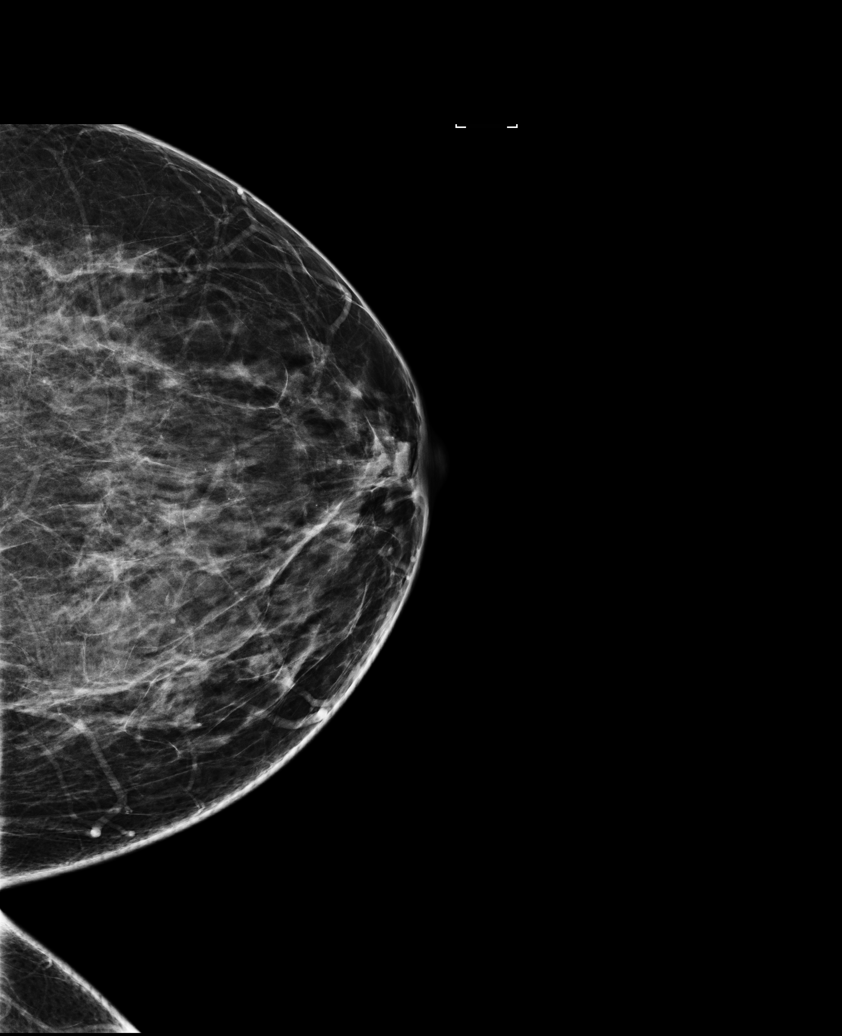

[R CC]
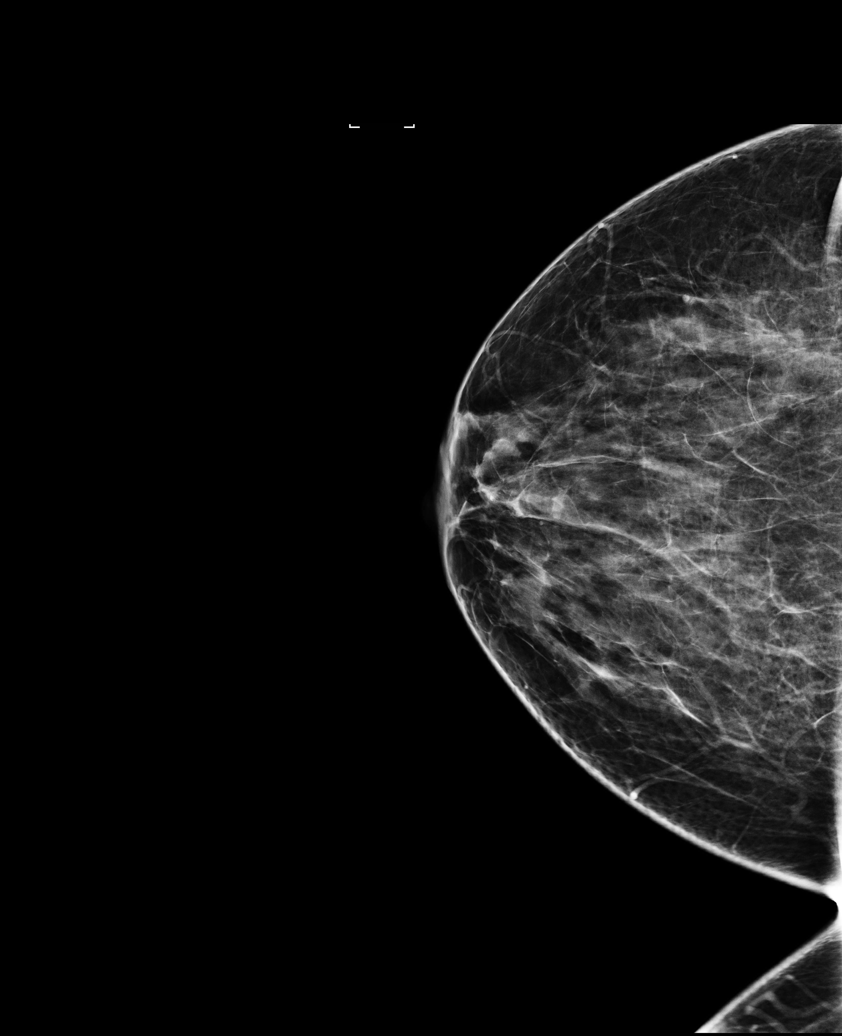

[L CC tomo · tomo slice 33/66.0]
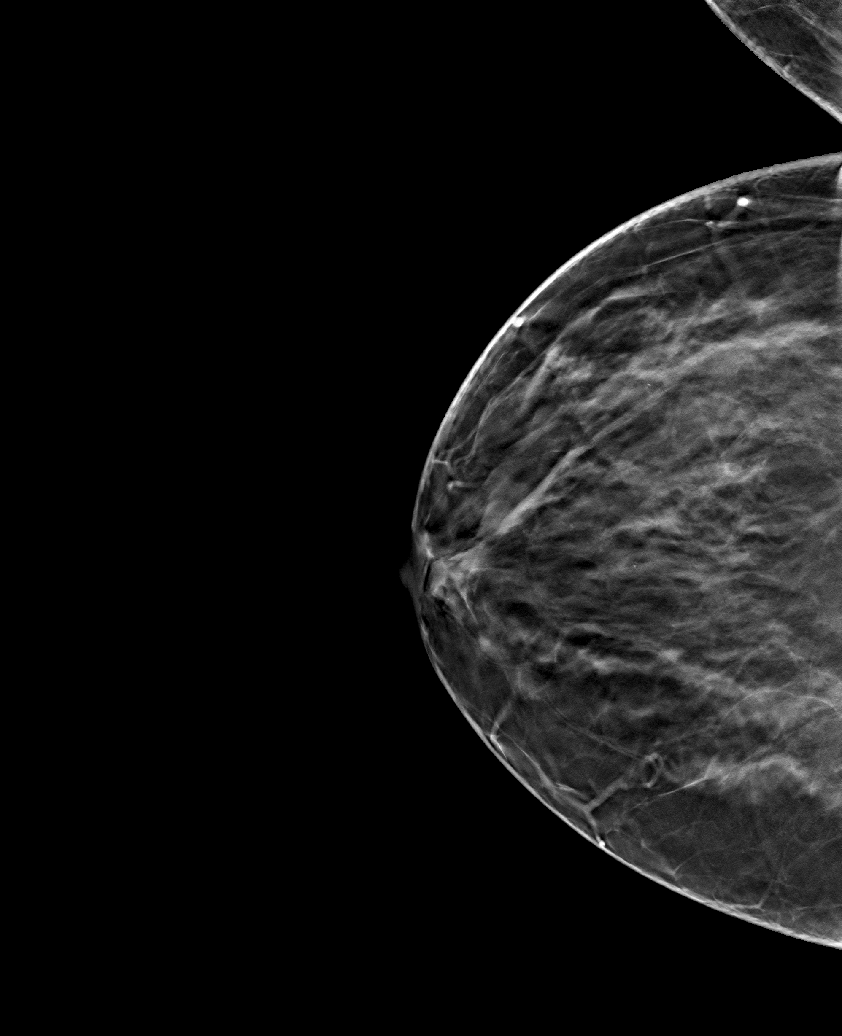

[6 of 25 positions shown; findings below may reference images not displayed]

ACR Breast Density Category c: The breast tissue is heterogeneously
dense, which may obscure small masses.
FINDINGS: There are no findings suspicious for malignancy. Images were
processed with CAD.
IMPRESSION: No mammographic evidence of malignancy. A result letter of this
screening mammogram will be mailed directly to the patient.

RECOMMENDATION:
Screening mammogram in one year. (Code:TN-[id number])

BI-RADS CATEGORY  1: Negative.

## 2018-02-13 DIAGNOSIS — R030 Elevated blood-pressure reading, without diagnosis of hypertension: Secondary | ICD-10-CM | POA: Diagnosis not present

## 2018-02-13 DIAGNOSIS — Z13220 Encounter for screening for lipoid disorders: Secondary | ICD-10-CM | POA: Diagnosis not present

## 2018-02-13 DIAGNOSIS — Z1239 Encounter for other screening for malignant neoplasm of breast: Secondary | ICD-10-CM | POA: Diagnosis not present

## 2018-03-08 DIAGNOSIS — Z0001 Encounter for general adult medical examination with abnormal findings: Secondary | ICD-10-CM | POA: Diagnosis not present

## 2018-03-08 DIAGNOSIS — Z13220 Encounter for screening for lipoid disorders: Secondary | ICD-10-CM | POA: Diagnosis not present

## 2018-03-09 DIAGNOSIS — Z803 Family history of malignant neoplasm of breast: Secondary | ICD-10-CM | POA: Diagnosis not present

## 2018-03-09 DIAGNOSIS — Z1231 Encounter for screening mammogram for malignant neoplasm of breast: Secondary | ICD-10-CM | POA: Diagnosis not present

## 2018-03-15 DIAGNOSIS — Z0001 Encounter for general adult medical examination with abnormal findings: Secondary | ICD-10-CM | POA: Diagnosis not present

## 2018-03-15 DIAGNOSIS — R030 Elevated blood-pressure reading, without diagnosis of hypertension: Secondary | ICD-10-CM | POA: Diagnosis not present

## 2018-08-13 ENCOUNTER — Encounter: Payer: Self-pay | Admitting: Internal Medicine

## 2021-05-24 ENCOUNTER — Encounter: Payer: Self-pay | Admitting: Internal Medicine
